# Patient Record
Sex: Female | Born: 1990
Health system: Southern US, Community
[De-identification: ages and names within clinical notes are randomized; demographics above are authoritative.]

## PROBLEM LIST (undated history)

## (undated) DIAGNOSIS — F909 Attention-deficit hyperactivity disorder, unspecified type: Secondary | ICD-10-CM

## (undated) DIAGNOSIS — F329 Major depressive disorder, single episode, unspecified: Secondary | ICD-10-CM

## (undated) DIAGNOSIS — R87619 Unspecified abnormal cytological findings in specimens from cervix uteri: Secondary | ICD-10-CM

## (undated) DIAGNOSIS — K219 Gastro-esophageal reflux disease without esophagitis: Secondary | ICD-10-CM

## (undated) DIAGNOSIS — F411 Generalized anxiety disorder: Secondary | ICD-10-CM

## (undated) DIAGNOSIS — E739 Lactose intolerance, unspecified: Secondary | ICD-10-CM

## (undated) HISTORY — DX: Generalized anxiety disorder: F41.1

## (undated) HISTORY — DX: Unspecified abnormal cytological findings in specimens from cervix uteri: R87.619

## (undated) HISTORY — DX: Lactose intolerance, unspecified: E73.9

## (undated) HISTORY — PX: WISDOM TOOTH EXTRACTION: SHX21

## (undated) HISTORY — PX: COCCYX REMOVAL: SHX600

## (undated) HISTORY — DX: Attention-deficit hyperactivity disorder, unspecified type: F90.9

## (undated) HISTORY — DX: Major depressive disorder, single episode, unspecified: F32.9

## (undated) HISTORY — DX: Gastro-esophageal reflux disease without esophagitis: K21.9

---
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
## 2010-09-23 HISTORY — PX: COCCYX REMOVAL: SHX600

## 2012-01-15 DIAGNOSIS — K589 Irritable bowel syndrome without diarrhea: Secondary | ICD-10-CM | POA: Insufficient documentation

## 2014-02-01 LAB — HEPATITIS B SURFACE ANTIBODY, QUANTITATIVE: Hep B Surface Ab, Qual: 136

## 2014-05-31 LAB — BASIC METABOLIC PANEL
BUN: 10 mg/dL (ref 4–21)
CREATININE: 0.9 mg/dL (ref 0.5–1.1)
Glucose: 44 mg/dL
POTASSIUM: 4.2 mmol/L (ref 3.4–5.3)
Sodium: 139 mmol/L (ref 137–147)

## 2014-05-31 LAB — CBC AND DIFFERENTIAL
HCT: 44 % (ref 36–46)
Hemoglobin: 14.7 g/dL (ref 12.0–16.0)
PLATELETS: 185 10*3/uL (ref 150–399)
WBC: 6.8 10*3/mL

## 2014-05-31 LAB — COMPREHENSIVE METABOLIC PANEL
Albumin Serum: 4.9
CO2: 28 mmol/L
Protein: 7.7

## 2014-05-31 LAB — HEPATIC FUNCTION PANEL
ALT: 12 U/L (ref 7–35)
AST: 18 U/L (ref 13–35)

## 2014-05-31 LAB — VITAMIN D 25 HYDROXY (VIT D DEFICIENCY, FRACTURES): VIT D 25 HYDROXY: 36

## 2014-05-31 LAB — TSH: TSH: 0.65 u[IU]/mL (ref 0.41–5.90)

## 2014-09-19 LAB — TB SKIN TEST
Induration: 0 mm
TB SKIN TEST: NEGATIVE

## 2015-06-03 ENCOUNTER — Encounter: Payer: Self-pay | Admitting: Emergency Medicine

## 2015-06-03 ENCOUNTER — Emergency Department
Admission: EM | Admit: 2015-06-03 | Discharge: 2015-06-03 | Disposition: A | Discharge status: Home / Self Care | Payer: 59 | Source: Home / Self Care | Attending: Family Medicine | Admitting: Family Medicine

## 2015-06-03 ENCOUNTER — Other Ambulatory Visit: Payer: Self-pay | Admitting: Family Medicine

## 2015-06-03 DIAGNOSIS — R112 Nausea with vomiting, unspecified: Secondary | ICD-10-CM

## 2015-06-03 DIAGNOSIS — R197 Diarrhea, unspecified: Secondary | ICD-10-CM

## 2015-06-03 LAB — POCT CBC W AUTO DIFF (K'VILLE URGENT CARE)

## 2015-06-03 MED ORDER — ONDANSETRON HCL 4 MG/2ML IJ SOLN
4.0000 mg | Freq: Once | INTRAMUSCULAR | Status: AC
  Administered 2015-06-03: 4 mg via INTRAMUSCULAR

## 2015-06-03 MED ORDER — ONDANSETRON 4 MG PO TBDP: ORAL_TABLET | ORAL | Status: DC

## 2015-06-03 NOTE — ED Notes (Signed)
Patient is a nurse in ICU; began feeling weak 2 days ago; diarrhea started yesterday; nausea and vomiting today; took care of patient with known Cdiff.

## 2015-06-03 NOTE — ED Provider Notes (Signed)
CSN: [id number]     Arrival date & time 06/03/15  1140 History   First MD Initiated Contact with Patient 06/03/15 1225     Chief Complaint  Patient presents with  . Diarrhea  . Nausea  . Emesis  . Generalized Body Aches  . Fatigue     HPI Comments: About 3 days ago patient developed myalgias and fatigue.  Yesterday she had an episode of watery diarrhea, and five episodes today.  She denies hematochezia.  At 3am today she developed nausea, and later today she had two episodes of vomiting.  She has had crampy abdominal discomfort.  Her urine has been normal.  She notes that she ate cereal/milk this morning. 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  Patient is a 24 y.o. female presenting with diarrhea. The history is provided by the patient.  Diarrhea Quality:  Watery Severity:  Moderate Onset quality:  Sudden Number of episodes:  6 Duration:  2 days Timing:  Intermittent Progression:  Improving Relieved by:  None tried Exacerbated by: eating. Ineffective treatments:  None tried Associated symptoms: abdominal pain, myalgias and vomiting   Associated symptoms: no arthralgias, no chills, no recent cough, no diaphoresis, no fever, no headaches and no URI   Vomiting:    Quality:  Stomach contents   Severity:  Mild   Duration:  1 day   Timing:  Intermittent   Progression:  Unchanged Risk factors: sick contacts   Risk factors: no recent antibiotic use, no suspicious food intake and no travel to endemic areas     Past Medical History  Diagnosis Date  . Anxiety    Past Surgical History  Procedure Laterality Date  . Tonsillectomy    . Coccyx removal     History reviewed. No pertinent family history. Social History  Substance Use Topics  . Smoking status: Never Smoker   . Smokeless tobacco: None  . Alcohol Use: Yes   OB History    No data available     Review of Systems  Constitutional: Negative for  fever, chills and diaphoresis.  Gastrointestinal: Positive for vomiting, abdominal pain and diarrhea.  Musculoskeletal: Positive for myalgias. Negative for arthralgias.  Neurological: Negative for headaches.  All other systems reviewed and are negative.   Allergies  Review of patient's allergies indicates no known allergies.  Home Medications   Prior to Admission medications   Medication Sig Start Date End Date Taking? Authorizing Provider  citalopram (CELEXA) 10 MG tablet Take 10 mg by mouth daily.   Yes Historical Provider, MD  ondansetron (ZOFRAN ODT) 4 MG disintegrating tablet Take one tab by mouth Q6hr prn nausea 06/03/15   Kandra Nicolas, MD   Meds Ordered and Administered this Visit   Medications  ondansetron [facility]) injection 4 mg (not administered)    BP 94/63 mmHg  Pulse 103  Temp(Src) 98.2 F (36.8 C) (Oral)  Ht 5\' 6"  (1.676 m)  Wt 142 lb (64.411 kg)  BMI 22.93 kg/m2  SpO2 97% No data found.   Physical Exam  Constitutional: She is oriented to person, place, and time. She appears well-developed and well-nourished. No distress.  HENT:  Head: Normocephalic.  Nose: Nose normal.  Mouth/Throat: Oropharynx is clear and moist.  Eyes: Conjunctivae are normal. Pupils are equal, round, and reactive to light.  Neck: Neck supple.  Cardiovascular: Normal heart sounds.   Pulmonary/Chest: Breath sounds normal.  Abdominal: Soft. Normal  appearance and bowel sounds are normal. She exhibits no distension and no mass. There is no hepatosplenomegaly or hepatomegaly. There is tenderness in the periumbilical area. There is no rebound, no guarding and no CVA tenderness.    Abdomen has mild diffuse peri-umbilical tenderness  as noted on diagram.  Bowel sounds are present.    Musculoskeletal: She exhibits no edema.  Lymphadenopathy:    She has no cervical adenopathy.  Neurological: She is alert and oriented to person, place, and time.  Skin: Skin is warm and dry. No rash noted.    Nursing note and vitals reviewed.   ED Course  Procedures  None    Labs Reviewed  GI PATHOGEN PANEL BY PCR, STOOL  POCT CBC W AUTO DIFF ([facility]):  WBC 7.1; LY 12.8; MO 5.6; GR 81.6; Hgb 14.7; Platelets 181      MDM   1. Nausea and vomiting, vomiting of unspecified type; suspect viral gastroenteritis   2. Diarrhea    C-diff PCR pending. Zofran 4mg  IM administered.  Rx for Zofran ODT 4mg  Begin clear liquids (Pedialyte while having diarrhea) until improved, then advance to a [employer] (Bananas, Rice, Applesauce, Toast).  Then gradually resume a regular diet when tolerated.  Avoid milk products until well.  To decrease diarrhea, mix one teaspoon Citrucel (methylcellulose) in 2 oz water and drink one to three times daily.  Do not drink extra fluids with this dose and do not drink fluids for one hour afterwards.  When stools become more formed, may take Imodium (loperamide) once or twice daily to decrease stool frequency.  If symptoms become significantly worse during the night or over the weekend, proceed to the local emergency room.  Followup with Family Doctor if not improved in 4 days.    Kandra Nicolas, MD 06/06/15 1901

## 2015-06-03 NOTE — Discharge Instructions (Signed)

## 2015-06-07 ENCOUNTER — Telehealth: Payer: Self-pay | Admitting: Emergency Medicine

## 2015-06-09 ENCOUNTER — Telehealth: Payer: Self-pay | Admitting: Emergency Medicine

## 2015-06-13 ENCOUNTER — Ambulatory Visit (INDEPENDENT_AMBULATORY_CARE_PROVIDER_SITE_OTHER): Payer: 59 | Admitting: Osteopathic Medicine

## 2015-06-13 ENCOUNTER — Encounter: Payer: Self-pay | Admitting: Osteopathic Medicine

## 2015-06-13 VITALS — BP 115/72 | HR 95 | Ht 66.0 in | Wt 138.0 lb

## 2015-06-13 DIAGNOSIS — Z23 Encounter for immunization: Secondary | ICD-10-CM

## 2015-06-13 DIAGNOSIS — K219 Gastro-esophageal reflux disease without esophagitis: Secondary | ICD-10-CM

## 2015-06-13 HISTORY — DX: Gastro-esophageal reflux disease without esophagitis: K21.9

## 2015-06-13 MED ORDER — OMEPRAZOLE 20 MG PO TBEC: 20.0000 mg | DELAYED_RELEASE_TABLET | Freq: Every day | ORAL | Status: DC

## 2015-06-13 NOTE — Patient Instructions (Signed)
OMEPRAZOLE 20mg  TABS: Can take 2 tabs or dose twice daily for severe symptoms Continue this medication for 6 - 12 weeks, then will discontinue  If symptoms return once medication is stopped, will plan on maintenance therapy with Omeprazole (Prilosec) or Ranitidine (Zantac). If symptoms resolve, then will treat as needed. If symptoms show no improvement or get worse, or if medication isn't giving much benefit, will consider referral to GI for EGD procedure.      Gastroesophageal Reflux Disease, Adult Gastroesophageal reflux disease (GERD) happens when acid from your stomach flows up into the esophagus. When acid comes in contact with the esophagus, the acid causes soreness (inflammation) in the esophagus. Over time, GERD may create small holes (ulcers) in the lining of the esophagus. CAUSES   Increased body weight. This puts pressure on the stomach, making acid rise from the stomach into the esophagus.  Smoking. This increases acid production in the stomach.  Drinking alcohol. This causes decreased pressure in the lower esophageal sphincter (valve or ring of muscle between the esophagus and stomach), allowing acid from the stomach into the esophagus.  Late evening meals and a full stomach. This increases pressure and acid production in the stomach.  A malformed lower esophageal sphincter. Sometimes, no cause is found. SYMPTOMS   Burning pain in the lower part of the mid-chest behind the breastbone and in the mid-stomach area. This may occur twice a week or more often.  Trouble swallowing.  Sore throat.  Dry cough.  Asthma-like symptoms including chest tightness, shortness of breath, or wheezing. DIAGNOSIS  Your caregiver may be able to diagnose GERD based on your symptoms. In some cases, X-rays and other tests may be done to check for complications or to check the condition of your stomach and esophagus. TREATMENT  Your caregiver may recommend over-the-counter or prescription  medicines to help decrease acid production. Ask your caregiver before starting or adding any new medicines.  HOME CARE INSTRUCTIONS   Change the factors that you can control. Ask your caregiver for guidance concerning weight loss, quitting smoking, and alcohol consumption.  Avoid foods and drinks that make your symptoms worse, such as:  Caffeine or alcoholic drinks.  Chocolate.  Peppermint or mint flavorings.  Garlic and onions.  Spicy foods.  Citrus fruits, such as oranges, lemons, or limes.  Tomato-based foods such as sauce, chili, salsa, and pizza.  Fried and fatty foods.  Avoid lying down for the 3 hours prior to your bedtime or prior to taking a nap.  Eat small, frequent meals instead of large meals.  Wear loose-fitting clothing. Do not wear anything tight around your waist that causes pressure on your stomach.  Raise the head of your bed 6 to 8 inches with wood blocks to help you sleep. Extra pillows will not help.  Only take over-the-counter or prescription medicines for pain, discomfort, or fever as directed by your caregiver.  Do not take aspirin, ibuprofen, or other nonsteroidal anti-inflammatory drugs (NSAIDs). SEEK IMMEDIATE MEDICAL CARE IF:   You have pain in your arms, neck, jaw, teeth, or back.  Your pain increases or changes in intensity or duration.  You develop nausea, vomiting, or sweating (diaphoresis).  You develop shortness of breath, or you faint.  Your vomit is green, yellow, black, or looks like coffee grounds or blood.  Your stool is red, bloody, or black. These symptoms could be signs of other problems, such as heart disease, gastric bleeding, or esophageal bleeding. MAKE SURE YOU:   Understand these instructions.  Will watch your condition.  Will get help right away if you are not doing well or get worse. Document Released: 06/19/2005 Document Revised: 12/02/2011 Document Reviewed: 03/29/2011 [facility] Patient Information 2015  [place], [place]. This information is not intended to replace advice given to you by your health care provider. Make sure you discuss any questions you have with your health care provider.

## 2015-06-13 NOTE — Progress Notes (Signed)
HPI: Kelly Haynes is a 24 y.o. female who presents to [facility]  today for chief complaint of:  Chief Complaint  Patient presents with  . Establish Care  . Gastrophageal Reflux    ACID REFLUX . Location: epigastric . Quality: burning . Severity: moderate . Duration: few years (4 or more) . Timing: intermittent, worse over past few months.  . Modifying factors: previously on prescription but it din't help very much. Has tried omeprazole 14 days over the counter x 2 rounds and this helped. Has bene off this for few week and pain came back. Not tested for H Pylori that she can remember . Assoc signs/symptoms: hx vomiting few years ago  Had EGD few years ago, showed gastritis and benign polyp, repeat showed no polyp, was told she just had bad acid reflus.   See below for preventive care  Past medical, social and family history reviewed: Past Medical History  Diagnosis Date  . Anxiety    Past Surgical History  Procedure Laterality Date  . Tonsillectomy    . Coccyx removal     Social History  Substance Use Topics  . Smoking status: Never Smoker   . Smokeless tobacco: Not on file  . Alcohol Use: Yes   No family history on file.  Current Outpatient Prescriptions  Medication Sig Dispense Refill  . citalopram (CELEXA) 10 MG tablet Take 10 mg by mouth daily.     No current facility-administered medications for this visit.   No Known Allergies    Review of Systems: CONSTITUTIONAL: Neg fever/chills, no unintentional weight changes HEAD/EYES/EARS/NOSE/THROAT: No headache/vision change or hearing change, no sore throat CARDIAC: No chest pain/pressure/palpitations, no orthopnea RESPIRATORY: No cough/shortness of breath/wheeze GASTROINTESTINAL: No nausea/vomiting/abdominal pain/blood in stool/diarrhea/constipation. (+) acid reflux MUSCULOSKELETAL: No myalgia/arthralgia GENITOURINARY: No incontinence, No abnormal genital  bleeding/discharge SKIN: No rash/wounds/concerning lesions HEM/ONC: No easy bruising/bleeding, no abnormal lymph node ENDOCRINE: No polyuria/polydipsia/polyphagia, no heat/cold intolerance  NEUROLOGIC: No weakness/dizzines/slurred speech PSYCHIATRIC: No concerns with depression/anxiety or sleep problems    Exam:  BP 115/72 mmHg  Pulse 95  Ht 5\' 6"  (1.676 m)  Wt 138 lb (62.596 kg)  BMI 22.28 kg/m2  SpO2 95% Constitutional: VSS, see above. General Appearance: alert, well-developed, well-nourished, NAD Eyes: Normal lids and conjunctive, non-icteric sclera, PERRLA Ears, Nose, Mouth, Throat: Normal external inspection ears/nares/mouth/lips/gums, Normal TM bilaterally, MMM, posterior pharynx without erythema/exudate Neck: No masses, trachea midline. No thyroid enlargement/tenderness/mass appreciated Respiratory: Normal respiratory effort. no wheeze/rhonchi/rales Cardiovascular: S1/S2 normal, no murmur/rub/gallop auscultated. RRR. No carotid bruit or JVD. No abdominal aortic bruit. Pedal pulse II/IV bilaterally DP and PT. No lower extremity edema. Gastrointestinal: Nontender, no masses. No hepatomegaly, no splenomegaly. No hernia appreciated. Bowel sounds normal. Rectal exam deferred.  Musculoskeletal: Gait normal. No clubbing/cyanosis of digits.  Neurological: No cranial nerve deficit on limited exam. Motor and sensation intact and symmetric Psychiatric: Normal judgment/insight. Normal mood and affect. Oriented x3.    No results found for this or any previous visit (from the past 72 hour(s)).    ASSESSMENT/PLAN:  Gastroesophageal reflux disease, esophagitis presence not specified - Plan: H. pylori breath test, Omeprazole (EQ OMEPRAZOLE) 20 MG TBEC  OMEPRAZOLE 20mg  TABS: Can take 2 tabs or dose twice daily for severe symptoms Continue this medication for 6 - 12 weeks, then will discontinue  If symptoms return once medication is stopped, will plan on maintenance therapy with Omeprazole  (Prilosec) or Ranitidine (Zantac). If symptoms resolve, then will treat as needed. If symptoms show  no improvement or get worse, or if medication isn't giving much benefit, will consider referral to GI for EGD procedure.   Await H Pylori test   FEMALE PREVENTIVE CARE  ANNUAL SCREENING/COUNSELING Tobacco - Never  Alcohol - social drinker Diet/Exercise - HEALTHY HABITS DISCUSSED TO DECREASE CV RISK Sexual Health - Yes with female. STI - The patient denies history of sexually transmitted disease. INTERESTED IN STI TESTING - no Depression - PQH2 Not Available Domestic violence concerns - no HTN SCREENING - SEE VITALS Vaccination status - SEE BELOW  INFECTIOUS DISEASE SCREENING HIV - all adults 15-65 - does not need GC/CT - sexually active - does not need HepC - born 24-1967 - does not need TB - if risk/required by employer - does not need  DISEASE SCREENING Lipid - (Low risk screen M35/F45; High risk screen M25/F35 if HTN, Tob, FH CHD M<55/F<65) - does not need DM2 (45+ or Risk = FH 1st deg DM, Hx GDM, overweight/sedentary, high-risk ethnicity, HTN) - does not need Osteoporosis - age 45+ or one sooner if risk - does not need  CANCER SCREENING Cervical - Pap q3 yr age 55+, Pap + HPV q5y age 30+ - PAP - does not need Breast - Mammo age 39+ (C) and biennial age 33-75 (A) - 13 - does not need Lung - annual low dose CT Chest age 16-75 w/ 30+ PY, current/quit past 15 years - CT - does not need Colon - age 67+ or 26 years of age prior to [place] Dx - GI REFERRAL - does not need  ADULT VACCINATION Influenza - annual - was given Td booster every 10 years - already has HPV - age <53yo - was not indicated Zoster - age 65+ - was not indicated Pneumonia - age 83+ sooner if risk (DM, smoker, other) - was not indicated  OTHER Fall - exercise and Vit D age 41+ - does not need Consider ASA - age 35-59 - does not need

## 2015-06-14 LAB — H. PYLORI BREATH TEST: H. PYLORI BREATH TEST: NOT DETECTED

## 2015-06-16 ENCOUNTER — Encounter: Payer: Self-pay | Admitting: Osteopathic Medicine

## 2015-06-16 DIAGNOSIS — Z9089 Acquired absence of other organs: Secondary | ICD-10-CM | POA: Insufficient documentation

## 2015-06-16 DIAGNOSIS — F419 Anxiety disorder, unspecified: Secondary | ICD-10-CM | POA: Insufficient documentation

## 2015-06-16 DIAGNOSIS — F411 Generalized anxiety disorder: Secondary | ICD-10-CM

## 2015-06-16 DIAGNOSIS — Z975 Presence of (intrauterine) contraceptive device: Secondary | ICD-10-CM | POA: Insufficient documentation

## 2015-06-16 DIAGNOSIS — K317 Polyp of stomach and duodenum: Secondary | ICD-10-CM | POA: Insufficient documentation

## 2015-06-16 DIAGNOSIS — F418 Other specified anxiety disorders: Secondary | ICD-10-CM | POA: Insufficient documentation

## 2015-06-16 DIAGNOSIS — E739 Lactose intolerance, unspecified: Secondary | ICD-10-CM | POA: Insufficient documentation

## 2015-06-16 DIAGNOSIS — F329 Major depressive disorder, single episode, unspecified: Secondary | ICD-10-CM | POA: Insufficient documentation

## 2015-06-16 DIAGNOSIS — F32A Depression, unspecified: Secondary | ICD-10-CM

## 2015-06-16 HISTORY — PX: TONSILLECTOMY: SUR1361

## 2015-06-16 HISTORY — DX: Lactose intolerance, unspecified: E73.9

## 2015-06-16 HISTORY — DX: Depression, unspecified: F32.A

## 2015-06-16 HISTORY — DX: Generalized anxiety disorder: F41.1

## 2015-08-30 ENCOUNTER — Encounter: Payer: Self-pay | Admitting: Osteopathic Medicine

## 2015-10-03 ENCOUNTER — Other Ambulatory Visit: Payer: Self-pay | Admitting: Osteopathic Medicine

## 2015-10-03 MED FILL — CITALOPRAM HBR 20 MG TABLET: 20 | 90 days supply | Qty: 45 | Fill #1

## 2015-10-03 MED FILL — ALPRAZolam 0.5 MG TABS: 0.5 | 30 days supply | Qty: 30 | Fill #1

## 2015-10-04 MED FILL — OMEPRAZOLE DR 20 MG CAPSULE: 20 | 23 days supply | Qty: 30 | Fill #0

## 2015-10-25 DIAGNOSIS — Z01419 Encounter for gynecological examination (general) (routine) without abnormal findings: Secondary | ICD-10-CM | POA: Diagnosis not present

## 2015-10-25 DIAGNOSIS — Z139 Encounter for screening, unspecified: Secondary | ICD-10-CM | POA: Diagnosis not present

## 2015-10-25 DIAGNOSIS — Z8742 Personal history of other diseases of the female genital tract: Secondary | ICD-10-CM | POA: Diagnosis not present

## 2015-11-15 MED FILL — OMEPRAZOLE DR 20 MG CAPSULE: 20 | 23 days supply | Qty: 30 | Fill #1

## 2015-12-13 DIAGNOSIS — F3340 Major depressive disorder, recurrent, in remission, unspecified: Secondary | ICD-10-CM | POA: Diagnosis not present

## 2015-12-13 DIAGNOSIS — F900 Attention-deficit hyperactivity disorder, predominantly inattentive type: Secondary | ICD-10-CM | POA: Diagnosis not present

## 2015-12-13 DIAGNOSIS — F5112 Insufficient sleep syndrome: Secondary | ICD-10-CM | POA: Diagnosis not present

## 2015-12-13 DIAGNOSIS — F411 Generalized anxiety disorder: Secondary | ICD-10-CM | POA: Diagnosis not present

## 2015-12-21 MED FILL — ALPRAZolam 0.5 MG TABS: 0.5 | 7 days supply | Qty: 30 | Fill #0

## 2015-12-21 MED FILL — OMEPRAZOLE DR 20 MG CAPSULE: 20 | 23 days supply | Qty: 30 | Fill #2

## 2015-12-21 MED FILL — traZODone HCL 100 MG TABS: 100 | 30 days supply | Qty: 60 | Fill #0

## 2015-12-21 MED FILL — CITALOPRAM HBR 10 MG TABLET: 10 | 30 days supply | Qty: 30 | Fill #0

## 2016-01-18 DIAGNOSIS — F900 Attention-deficit hyperactivity disorder, predominantly inattentive type: Secondary | ICD-10-CM | POA: Diagnosis not present

## 2016-01-18 DIAGNOSIS — F3340 Major depressive disorder, recurrent, in remission, unspecified: Secondary | ICD-10-CM | POA: Diagnosis not present

## 2016-01-18 DIAGNOSIS — F5112 Insufficient sleep syndrome: Secondary | ICD-10-CM | POA: Diagnosis not present

## 2016-01-18 DIAGNOSIS — F411 Generalized anxiety disorder: Secondary | ICD-10-CM | POA: Diagnosis not present

## 2016-01-23 MED FILL — OMEPRAZOLE DR 20 MG CAPSULE: 20 | 23 days supply | Qty: 30 | Fill #3

## 2016-02-23 ENCOUNTER — Other Ambulatory Visit: Payer: Self-pay | Admitting: Osteopathic Medicine

## 2016-02-23 MED FILL — ALPRAZolam 0.5 MG TABS: 0.5 | 7 days supply | Qty: 30 | Fill #1

## 2016-02-23 MED FILL — CITALOPRAM HBR 10 MG TABLET: 10 | 30 days supply | Qty: 30 | Fill #0

## 2016-02-23 MED FILL — OMEPRAZOLE DR 20 MG CAPSULE: 20 | 30 days supply | Qty: 30 | Fill #0

## 2016-03-18 MED FILL — CITALOPRAM HBR 10 MG TABLET: 10 | 30 days supply | Qty: 30 | Fill #1

## 2016-03-18 MED FILL — OMEPRAZOLE DR 20 MG CAPSULE: 20 | 30 days supply | Qty: 30 | Fill #1

## 2016-04-12 DIAGNOSIS — F411 Generalized anxiety disorder: Secondary | ICD-10-CM | POA: Diagnosis not present

## 2016-04-12 DIAGNOSIS — F3340 Major depressive disorder, recurrent, in remission, unspecified: Secondary | ICD-10-CM | POA: Diagnosis not present

## 2016-04-12 DIAGNOSIS — F5112 Insufficient sleep syndrome: Secondary | ICD-10-CM | POA: Diagnosis not present

## 2016-04-12 DIAGNOSIS — F900 Attention-deficit hyperactivity disorder, predominantly inattentive type: Secondary | ICD-10-CM | POA: Diagnosis not present

## 2016-04-23 MED FILL — CITALOPRAM HBR 10 MG TABLET: 10 | 30 days supply | Qty: 30 | Fill #0

## 2016-04-23 MED FILL — ALPRAZolam 0.5 MG TABS: 0.5 | 7 days supply | Qty: 30 | Fill #2

## 2016-04-23 MED FILL — OMEPRAZOLE DR 20 MG CAPSULE: 20 | 30 days supply | Qty: 30 | Fill #2

## 2016-05-29 MED FILL — OMEPRAZOLE DR 20 MG CAPSULE: 20 | 30 days supply | Qty: 30 | Fill #3

## 2016-05-29 MED FILL — CITALOPRAM HBR 10 MG TABLET: 10 | 30 days supply | Qty: 30 | Fill #1

## 2016-06-25 ENCOUNTER — Other Ambulatory Visit: Payer: Self-pay | Admitting: Osteopathic Medicine

## 2016-06-25 MED FILL — OMEPRAZOLE 20 MG CAPSULE DR: 20 | 30 days supply | Qty: 30 | Fill #0

## 2016-07-02 MED FILL — CITALOPRAM HBR 10 MG TABLET: 10 | 30 days supply | Qty: 30 | Fill #0

## 2016-07-23 DIAGNOSIS — F3340 Major depressive disorder, recurrent, in remission, unspecified: Secondary | ICD-10-CM | POA: Diagnosis not present

## 2016-07-23 DIAGNOSIS — F900 Attention-deficit hyperactivity disorder, predominantly inattentive type: Secondary | ICD-10-CM | POA: Diagnosis not present

## 2016-07-23 DIAGNOSIS — F411 Generalized anxiety disorder: Secondary | ICD-10-CM | POA: Diagnosis not present

## 2016-07-23 DIAGNOSIS — F5112 Insufficient sleep syndrome: Secondary | ICD-10-CM | POA: Diagnosis not present

## 2016-07-26 MED FILL — OMEPRAZOLE 20 MG CAPSULE DR: 20 | 30 days supply | Qty: 30 | Fill #1

## 2016-07-29 MED FILL — CITALOPRAM HBR 20 MG TABLET: 20 | 30 days supply | Qty: 30 | Fill #0

## 2016-07-29 MED FILL — ALPRAZolam 0.5 MG TABS: 0.5 | 30 days supply | Qty: 30 | Fill #0

## 2016-08-28 MED FILL — OMEPRAZOLE 20 MG CAPSULE DR: 20 | 30 days supply | Qty: 30 | Fill #2

## 2016-08-28 MED FILL — CITALOPRAM HBR 20 MG TABLET: 20 | 30 days supply | Qty: 30 | Fill #1

## 2016-10-02 MED FILL — OMEPRAZOLE 20 MG CAPSULE DR: 20 | 30 days supply | Qty: 30 | Fill #3

## 2016-10-02 MED FILL — ALPRAZolam 0.5 MG TABS: 0.5 | 30 days supply | Qty: 30 | Fill #1

## 2016-10-08 MED FILL — CITALOPRAM HBR 20 MG TABLET: 20 | 30 days supply | Qty: 30 | Fill #0

## 2016-11-01 DIAGNOSIS — F3340 Major depressive disorder, recurrent, in remission, unspecified: Secondary | ICD-10-CM | POA: Diagnosis not present

## 2016-11-01 DIAGNOSIS — F5112 Insufficient sleep syndrome: Secondary | ICD-10-CM | POA: Diagnosis not present

## 2016-11-01 DIAGNOSIS — F411 Generalized anxiety disorder: Secondary | ICD-10-CM | POA: Diagnosis not present

## 2016-11-01 DIAGNOSIS — F900 Attention-deficit hyperactivity disorder, predominantly inattentive type: Secondary | ICD-10-CM | POA: Diagnosis not present

## 2016-11-01 MED FILL — CITALOPRAM HBR 20 MG TABLET: 20 | 30 days supply | Qty: 30 | Fill #0

## 2016-11-01 MED FILL — ALPRAZolam 0.5 MG TABS: 0.5 | 7 days supply | Qty: 30 | Fill #0

## 2016-11-01 MED FILL — ARMODAFINIL 250 MG TABLET: 250 | 33 days supply | Qty: 30 | Fill #0

## 2016-11-12 ENCOUNTER — Encounter: Payer: Self-pay | Admitting: Physician Assistant

## 2016-11-12 ENCOUNTER — Ambulatory Visit (INDEPENDENT_AMBULATORY_CARE_PROVIDER_SITE_OTHER): Payer: 59 | Admitting: Physician Assistant

## 2016-11-12 VITALS — BP 112/73 | HR 80 | Temp 98.8°F | Resp 16 | Ht 66.0 in | Wt 136.0 lb

## 2016-11-12 DIAGNOSIS — F411 Generalized anxiety disorder: Secondary | ICD-10-CM

## 2016-11-12 DIAGNOSIS — Z30433 Encounter for removal and reinsertion of intrauterine contraceptive device: Secondary | ICD-10-CM | POA: Diagnosis not present

## 2016-11-12 DIAGNOSIS — Z7689 Persons encountering health services in other specified circumstances: Secondary | ICD-10-CM

## 2016-11-12 DIAGNOSIS — K219 Gastro-esophageal reflux disease without esophagitis: Secondary | ICD-10-CM | POA: Diagnosis not present

## 2016-11-12 MED ORDER — OMEPRAZOLE 40 MG PO CPDR: 40.0000 mg | DELAYED_RELEASE_CAPSULE | Freq: Every day | ORAL | 3 refills | Status: DC

## 2016-11-12 MED FILL — OMEPRAZOLE DR 40 MG CAPSULE: 40 | 90 days supply | Qty: 90 | Fill #0

## 2016-11-12 NOTE — Progress Notes (Signed)
Patient ID: Kelly Haynes, female     DOB: 02-Oct-1990, 26 y.o.    MRN: [id number]  PCP: Harrison Mons, PA-C  Chief Complaint  Patient presents with  . Establish Care    Subjective:   This patient is new to this practice and presents for evaluation of GERD and to establish care. She has moved and this practice is more convenient.   "My acid reflux is out of control." "I've been on omeprazole for several years now." Past two weeks she's been waking up with symptoms. Dietary changes have helped some. Eliminated caffeine, which is hard, since she works nights. S/P upper endoscopy x 3.  Review of Systems Dyspepsia. No CP, SOB, HA, dizziness. No urinary symptoms.   Prior to Admission medications   Medication Sig Start Date End Date Taking? Authorizing Provider  ALPRAZolam Duanne Moron) 0.5 MG tablet Take 0.5 mg by mouth at bedtime as needed for anxiety.   Yes Historical Provider, MD  citalopram (CELEXA) 20 MG tablet Take 20 mg by mouth daily.   Yes Historical Provider, MD  Multiple Vitamins-Minerals (MULTIVITAMIN PO) Take by mouth daily.   Yes Historical Provider, MD  Omeprazole (EQ OMEPRAZOLE) 20 MG TBEC Take 1 tablet (20 mg total) by mouth daily. (Can take 2 tabs daily x first 2 weeks of treatment) 06/13/15  Yes Emeterio Reeve, DO  omeprazole (PRILOSEC) 20 MG capsule TAKE 1 CAPSULE BY MOUTH ONCE DAILY *CAN TAKE 2 CAPSULES DAILY FOR FIRST 2 WEEKS OF TREATMENT* 06/25/16  Yes Emeterio Reeve, DO     No Known Allergies   Patient Active Problem List   Diagnosis Date Noted  . S/P tonsillectomy 06/16/2015  . Anxiety state 06/16/2015  . Depression 06/16/2015  . Lactose intolerance 06/16/2015  . Gastric polyp 06/16/2015  . IUD (intrauterine device) in place 06/16/2015  . GERD (gastroesophageal reflux disease) 06/13/2015  . Irritable bowel syndrome 01/15/2012     Family History  Problem Relation Age of Onset  . Depression Father   . Depression Sister   . Hypertension  Maternal Grandfather   . Hypertension Paternal Grandfather   . Depression Cousin      Social History   Social History  . Marital status: Single    Spouse name: N/A  . Number of children: 0  . Years of education: BSN   Occupational History  . CICU RN     [employer]   Social History Main Topics  . Smoking status: Never Smoker  . Smokeless tobacco: Never Used  . Alcohol use Yes     Comment: 2 drinks/week  . Drug use: No  . Sexual activity: Not Currently    Partners: Male    Birth control/ protection: IUD   Other Topics Concern  . Not on file   Social History Narrative   Lives with a roommate, also a Marine scientist.   Mother lives in [place], [place].   Father lives in [place], [place].   Smoke detectors in home? Yes    Guns in home? No    Consistent seatbelt use? Yes    Consistent dental brushing and flossing? Yes    Semi-Annual dental visits: Yes    Annual Eye exams: last visit 2 years ago            Objective:  Physical Exam           Assessment & Plan:  1. Encounter to establish care  2. Gastroesophageal reflux disease, esophagitis presence not specified Increase omeprazole to 40  mg daily.If symptoms persist, plan re-evaluation with GI. - omeprazole (PRILOSEC) 40 MG capsule; Take 1 capsule (40 mg total) by mouth daily.  Dispense: 90 capsule; Refill: 3  3. Generalized anxiety disorder Stable. Continue current treatment.  4. Encounter for removal and reinsertion of intrauterine contraceptive device (IUD) - Ambulatory referral to Obstetrics / Gynecology   Fara Chute, PA-C Physician Assistant-Certified [facility]

## 2016-11-12 NOTE — Patient Instructions (Addendum)
Things that often make reflux symptoms worse: Caffeine Carbonation (soda) Spicy foods Acidic foods (like tomato sauce, orange juice, lemonade) Fatty foods (including whole milk and ice cream) Stress (feeling sad, worried, nervous) Nicotine Alcohol NSAIDS (non-steroidal anti-inflammatories, like ibuprofen (Advil, Motrin) or naproxen (Aleve)).   You can try increasing the citalopram from 20 mg to 30 and then 40 mg if you need to. Just let me know if you find that the higher dose works and [employer] send a new prescription to your pharmacy.  IF you received an x-ray today, you will receive an invoice from [facility]. Please contact [facility] at [phone] with questions or concerns regarding your invoice.   IF you received labwork today, you will receive an invoice from [facility]. Please contact [facility] at [phone] with questions or concerns regarding your invoice.   Our billing staff will not be able to assist you with questions regarding bills from these companies.  You will be contacted with the lab results as soon as they are available. The fastest way to get your results is to activate your My Chart account. Instructions are located on the last page of this paperwork. If you have not heard from [place] regarding the results in 2 weeks, please contact this office.

## 2016-12-04 MED FILL — ALPRAZolam 0.5 MG TABS: 0.5 | 7 days supply | Qty: 30 | Fill #1

## 2016-12-04 MED FILL — CITALOPRAM HBR 20 MG TABLET: 20 | 30 days supply | Qty: 30 | Fill #1

## 2016-12-17 ENCOUNTER — Ambulatory Visit (INDEPENDENT_AMBULATORY_CARE_PROVIDER_SITE_OTHER): Payer: 59 | Admitting: Obstetrics & Gynecology

## 2016-12-17 ENCOUNTER — Encounter: Payer: Self-pay | Admitting: Obstetrics & Gynecology

## 2016-12-17 VITALS — BP 114/76 | HR 73 | Wt 135.8 lb

## 2016-12-17 DIAGNOSIS — Z113 Encounter for screening for infections with a predominantly sexual mode of transmission: Secondary | ICD-10-CM

## 2016-12-17 DIAGNOSIS — Z01419 Encounter for gynecological examination (general) (routine) without abnormal findings: Secondary | ICD-10-CM

## 2016-12-17 DIAGNOSIS — Z30433 Encounter for removal and reinsertion of intrauterine contraceptive device: Secondary | ICD-10-CM | POA: Diagnosis not present

## 2016-12-17 DIAGNOSIS — Z202 Contact with and (suspected) exposure to infections with a predominantly sexual mode of transmission: Secondary | ICD-10-CM

## 2016-12-17 MED ORDER — LEVONORGESTREL 20 MCG/24HR IU IUD
INTRAUTERINE_SYSTEM | Freq: Once | INTRAUTERINE | Status: AC
  Administered 2016-12-17: 1 via INTRAUTERINE

## 2016-12-17 NOTE — Patient Instructions (Signed)

## 2016-12-17 NOTE — Progress Notes (Signed)
Subjective:     Kelly Haynes is a 26 y.o. female here for a routine exam.  G0 LMP irreg and rare due to LnIUD Current complaints: non currently sexually active.     Gynecologic History No LMP recorded. Patient is not currently having periods (Reason: IUD). Contraception: IUD Last Pap:09/2014. Results were: normal Last mammogram: n/a.  Obstetric History OB History  No data available   The following portions of the patient's history were reviewed and updated as appropriate: allergies, current medications, past family history, past medical history, past social history, past surgical history and problem list.  Review of Systems Pertinent items are noted in HPI.    Objective:   BP 114/76   Pulse 73   Wt 135 lb 12.8 oz (61.6 kg)   BMI 21.92 kg/m  General Appearance:    Alert, cooperative, no distress, appears stated age  Head:    Normocephalic, without obvious abnormality, atraumatic  Eyes:    conjunctiva/corneas clear, EOM's intact, both eyes  Ears:    Normal external ear canals, both ears  Nose:   Nares normal, septum midline, mucosa normal, no drainage    or sinus tenderness  Throat:   Lips, mucosa, and tongue normal; teeth and gums normal  Neck:   Supple, symmetrical, trachea midline, no adenopathy;    thyroid:  no enlargement/tenderness/nodules  Back:     Symmetric, no curvature, ROM normal, no CVA tenderness  Lungs:     Clear to auscultation bilaterally, respirations unlabored  Chest Wall:    No tenderness or deformity   Heart:    Regular rate and rhythm, S1 and S2 normal, no murmur, rub   or gallop  Breast Exam:    No tenderness, masses, or nipple abnormality  Abdomen:     Soft, non-tender, bowel sounds active all four quadrants,    no masses, no organomegaly  Genitalia:    Normal female without lesion, discharge or tenderness   (see below)  Extremities:   Extremities normal, atraumatic, no cyanosis or edema  Pulses:   2+ and symmetric all extremities  Skin:   Skin color,  texture, turgor normal, no rashes or lesions    IUD Insertion Procedure Note Patient identified, informed consent performed.  Discussed risks of irregular bleeding, cramping, infection, malpositioning or misplacement of the IUD outside the uterus which may require further procedures. Time out was performed.  Urine pregnancy test negative.  Speculum placed in the vagina.  Cervix visualized.  Cleaned with Betadine x 2.  The IUD in place was grasped with forceps and removed in its entirety.  The cervix was then grasped anteriorly with a single tooth tenaculum.  Uterus sounded to 7 cm.  Mirena IUD placed per manufacturer's recommendations.  Strings trimmed to 3 cm. Tenaculum was removed, good hemostasis noted.  Patient tolerated procedure well.    Assessment:    Healthy female exam.   Contraception management- IUD removed and replaced   Plan:    Follow up in: 1 year.  for annual  Patient was given post-procedure instructions.  Patient was asked to follow up in 4 weeks for IUD check. f/u PAP and cx  Kelly Haynes, M.D., Kelly Haynes

## 2016-12-19 LAB — CERVICOVAGINAL ANCILLARY ONLY
Chlamydia: NEGATIVE
NEISSERIA GONORRHEA: NEGATIVE
TRICH (WINDOWPATH): NEGATIVE

## 2016-12-30 MED FILL — ARMODAFINIL 250 MG TABLET: 250 | 90 days supply | Qty: 90 | Fill #0

## 2017-01-09 ENCOUNTER — Encounter (HOSPITAL_COMMUNITY): Payer: Self-pay | Admitting: Family Medicine

## 2017-01-09 ENCOUNTER — Ambulatory Visit (HOSPITAL_COMMUNITY)
Admission: EM | Admit: 2017-01-09 | Discharge: 2017-01-09 | Disposition: A | Discharge status: Home / Self Care | Payer: 59 | Attending: Family Medicine | Admitting: Family Medicine

## 2017-01-09 DIAGNOSIS — R300 Dysuria: Secondary | ICD-10-CM | POA: Diagnosis not present

## 2017-01-09 DIAGNOSIS — R319 Hematuria, unspecified: Secondary | ICD-10-CM

## 2017-01-09 DIAGNOSIS — N3090 Cystitis, unspecified without hematuria: Secondary | ICD-10-CM

## 2017-01-09 DIAGNOSIS — Z3202 Encounter for pregnancy test, result negative: Secondary | ICD-10-CM

## 2017-01-09 LAB — POCT URINALYSIS DIP (DEVICE)
BILIRUBIN URINE: NEGATIVE
Glucose, UA: NEGATIVE mg/dL
KETONES UR: NEGATIVE mg/dL
NITRITE: NEGATIVE
Protein, ur: 100 mg/dL — AB
Specific Gravity, Urine: 1.02 (ref 1.005–1.030)
Urobilinogen, UA: 0.2 mg/dL (ref 0.0–1.0)
pH: 7.5 (ref 5.0–8.0)

## 2017-01-09 LAB — POCT PREGNANCY, URINE: Preg Test, Ur: NEGATIVE

## 2017-01-09 MED ORDER — CIPROFLOXACIN HCL 500 MG PO TABS
ORAL_TABLET | ORAL | Status: AC
  Filled 2017-01-09: qty 1

## 2017-01-09 MED ORDER — CIPROFLOXACIN HCL 500 MG PO TABS
500.0000 mg | ORAL_TABLET | Freq: Once | ORAL | Status: AC
  Administered 2017-01-09: 500 mg via ORAL

## 2017-01-09 MED ORDER — CIPROFLOXACIN HCL 500 MG PO TABS: 500.0000 mg | ORAL_TABLET | Freq: Two times a day (BID) | ORAL | 0 refills | Status: DC

## 2017-01-09 MED FILL — ALPRAZolam 0.5 MG TABS: 0.5 | 7 days supply | Qty: 30 | Fill #2

## 2017-01-09 NOTE — ED Provider Notes (Signed)
[place]    CSN: [id number] Arrival date & time: 01/09/17  1751     History   Chief Complaint Chief Complaint  Patient presents with  . Dysuria    HPI Cathyrn Deas is a 26 y.o. female.   Pt here for UTI symptoms. Patient's symptoms began this morning when she noticed dysuria. She works nights at [employer] so she saw up until about 3, but when she awoke she noticed more dysuria and hematuria.  Patient has had pyelonephritis in the past with fever and chills. She is denying these symptoms now, with no flank pain, nausea, or vomiting.      Past Medical History:  Diagnosis Date  . Anxiety state 06/16/2015  . Depression 06/16/2015  . GERD (gastroesophageal reflux disease) 06/13/2015  . Lactose intolerance 06/16/2015    Patient Active Problem List   Diagnosis Date Noted  . S/P tonsillectomy 06/16/2015  . Anxiety state 06/16/2015  . Depression 06/16/2015  . Lactose intolerance 06/16/2015  . Gastric polyp 06/16/2015  . IUD (intrauterine device) in place 06/16/2015  . GERD (gastroesophageal reflux disease) 06/13/2015  . Irritable bowel syndrome 01/15/2012    Past Surgical History:  Procedure Laterality Date  . COCCYX REMOVAL    . TONSILLECTOMY  06/16/2015  . WISDOM TOOTH EXTRACTION      OB History    No data available       Home Medications    Prior to Admission medications   Medication Sig Start Date End Date Taking? Authorizing Provider  ALPRAZolam Duanne Moron) 0.5 MG tablet Take 0.5 mg by mouth at bedtime as needed for anxiety.    Historical Provider, MD  ciprofloxacin (CIPRO) 500 MG tablet Take 1 tablet (500 mg total) by mouth 2 (two) times daily. 01/09/17   Robyn Haber, MD  citalopram (CELEXA) 20 MG tablet Take 20 mg by mouth daily.     Historical Provider, MD  Multiple Vitamins-Minerals (MULTIVITAMIN PO) Take by mouth daily.    Historical Provider, MD  omeprazole (PRILOSEC) 20 MG capsule  10/02/16   Historical Provider, MD  omeprazole (PRILOSEC) 40  MG capsule Take 1 capsule (40 mg total) by mouth daily. 11/12/16   Harrison Mons, PA-C    Family History Family History  Problem Relation Age of Onset  . Depression Father   . Depression Sister   . Hypertension Maternal Grandfather   . Hypertension Paternal Grandfather   . Depression Cousin     Social History Social History  Substance Use Topics  . Smoking status: Never Smoker  . Smokeless tobacco: Never Used  . Alcohol use Yes     Comment: 2 drinks/week     Allergies   Patient has no known allergies.   Review of Systems Review of Systems  Genitourinary: Positive for dysuria and hematuria.  All other systems reviewed and are negative.    Physical Exam Triage Vital Signs ED Triage Vitals [01/09/17 1806]  Enc Vitals Group     BP 132/80     Pulse Rate 82     Resp 18     Temp 97 F (36.1 C)     Temp Source Oral     SpO2      Weight      Height      Head Circumference      Peak Flow      Pain Score      Pain Loc      Pain Edu?      Excl. in [place]?  No data found.   Updated Vital Signs BP 132/80   Pulse 82   Temp 97 F (36.1 C) (Oral)   Resp 18    Physical Exam  Constitutional: She is oriented to person, place, and time. She appears well-developed and well-nourished.  HENT:  Right Ear: External ear normal.  Left Ear: External ear normal.  Mouth/Throat: Oropharynx is clear and moist.  Eyes: Conjunctivae and EOM are normal. Pupils are equal, round, and reactive to light.  Neck: Normal range of motion. Neck supple.  Pulmonary/Chest: Effort normal.  Abdominal:  No flank tenderness  Musculoskeletal: Normal range of motion.  Neurological: She is alert and oriented to person, place, and time.  Skin: Skin is warm and dry.  Nursing note and vitals reviewed.    UC Treatments / Results  Labs (all labs ordered are listed, but only abnormal results are displayed) Labs Reviewed  POCT URINALYSIS DIP (DEVICE)    EKG  EKG Interpretation None        Radiology No results found.  Procedures Procedures (including critical care time)  Medications Ordered in UC Medications  ciprofloxacin (CIPRO) tablet 500 mg (not administered)     Initial Impression / Assessment and Plan / UC Course  I have reviewed the triage vital signs and the nursing notes.  Pertinent labs & imaging results that were available during my care of the patient were reviewed by me and considered in my medical decision making (see chart for details).     Final Clinical Impressions(s) / UC Diagnoses   Final diagnoses:  Cystitis    New Prescriptions New Prescriptions   CIPROFLOXACIN (CIPRO) 500 MG TABLET    Take 1 tablet (500 mg total) by mouth 2 (two) times daily.     Robyn Haber, MD 01/09/17 [phone]

## 2017-01-09 NOTE — ED Triage Notes (Signed)
Pt here for UTI symptoms

## 2017-01-10 MED FILL — CIPROFLOXACIN HCL 500 MG TA: 500 | 5 days supply | Qty: 10 | Fill #0

## 2017-01-14 MED FILL — CITALOPRAM HBR 20 MG TABLET: 20 | 30 days supply | Qty: 30 | Fill #0

## 2017-01-15 ENCOUNTER — Ambulatory Visit (INDEPENDENT_AMBULATORY_CARE_PROVIDER_SITE_OTHER): Payer: 59 | Admitting: Obstetrics & Gynecology

## 2017-01-15 ENCOUNTER — Encounter: Payer: Self-pay | Admitting: Obstetrics & Gynecology

## 2017-01-15 VITALS — BP 119/76 | HR 83 | Wt 140.0 lb

## 2017-01-15 DIAGNOSIS — Z30431 Encounter for routine checking of intrauterine contraceptive device: Secondary | ICD-10-CM | POA: Diagnosis not present

## 2017-01-15 NOTE — Progress Notes (Signed)
History:  26 y.o. No obstetric history on file. here today for today for IUD string check; Mirena IUD was placed 12/17/16. No complaints about the Mirena, no concerning side effects.  The following portions of the patient's history were reviewed and updated as appropriate: allergies, current medications, past family history, past medical history, past social history, past surgical history and problem list. Last pap smear on 1/4/20156 was normal, neg HRHPV.  Review of Systems:  Pertinent items are noted in HPI.   Objective:  Physical Exam Blood pressure 119/76, pulse 83, weight 140 lb (63.5 kg). Gen: NAD Abd: Soft, nontender and nondistended Pelvic: Normal appearing external genitalia; normal appearing vaginal mucosa and cervix.  IUD strings visualized, about 3-4 cm in length outside cervix.   Assessment & Plan:  Normal IUD check. Patient to keep IUD in place for five years; can come in for removal if she desires pregnancy within the next five years. Routine preventative health maintenance measures emphasized. f/u for PAP in Jan 2019  [place]. Harraway-Smith, M.D., Cherlynn June

## 2017-01-24 DIAGNOSIS — F3340 Major depressive disorder, recurrent, in remission, unspecified: Secondary | ICD-10-CM | POA: Diagnosis not present

## 2017-01-24 DIAGNOSIS — F411 Generalized anxiety disorder: Secondary | ICD-10-CM | POA: Diagnosis not present

## 2017-01-24 DIAGNOSIS — F5112 Insufficient sleep syndrome: Secondary | ICD-10-CM | POA: Diagnosis not present

## 2017-01-24 DIAGNOSIS — F900 Attention-deficit hyperactivity disorder, predominantly inattentive type: Secondary | ICD-10-CM | POA: Diagnosis not present

## 2017-01-27 MED FILL — DEXTROAMP-AMPHETAMIN 20 MG: 20 | 30 days supply | Qty: 60 | Fill #0

## 2017-02-11 DIAGNOSIS — H5203 Hypermetropia, bilateral: Secondary | ICD-10-CM | POA: Diagnosis not present

## 2017-02-14 MED FILL — ALPRAZolam 0.5 MG TABS: 0.5 | 30 days supply | Qty: 30 | Fill #0

## 2017-02-14 MED FILL — OMEPRAZOLE DR 40 MG CAPSULE: 40 | 90 days supply | Qty: 90 | Fill #1

## 2017-02-14 MED FILL — CITALOPRAM HBR 20 MG TABLET: 20 | 30 days supply | Qty: 30 | Fill #0

## 2017-02-18 ENCOUNTER — Telehealth: Payer: 59 | Admitting: Family

## 2017-02-18 ENCOUNTER — Encounter: Payer: Self-pay | Admitting: Physician Assistant

## 2017-02-18 DIAGNOSIS — A499 Bacterial infection, unspecified: Secondary | ICD-10-CM | POA: Diagnosis not present

## 2017-02-18 DIAGNOSIS — N390 Urinary tract infection, site not specified: Secondary | ICD-10-CM

## 2017-02-18 MED ORDER — NITROFURANTOIN MONOHYD MACRO 100 MG PO CAPS: 100.0000 mg | ORAL_CAPSULE | Freq: Two times a day (BID) | ORAL | 0 refills | Status: DC

## 2017-02-18 MED FILL — NITROFURANTOIN MONO-MCR 100: 100 | 5 days supply | Qty: 10 | Fill #0

## 2017-02-18 NOTE — Progress Notes (Signed)

## 2017-02-28 ENCOUNTER — Ambulatory Visit (INDEPENDENT_AMBULATORY_CARE_PROVIDER_SITE_OTHER): Payer: 59 | Admitting: Physician Assistant

## 2017-02-28 ENCOUNTER — Encounter: Payer: Self-pay | Admitting: Physician Assistant

## 2017-02-28 VITALS — BP 104/66 | HR 71 | Temp 98.0°F | Resp 18 | Ht 66.0 in | Wt 135.2 lb

## 2017-02-28 DIAGNOSIS — Z8742 Personal history of other diseases of the female genital tract: Secondary | ICD-10-CM | POA: Insufficient documentation

## 2017-02-28 DIAGNOSIS — Z23 Encounter for immunization: Secondary | ICD-10-CM

## 2017-02-28 DIAGNOSIS — F900 Attention-deficit hyperactivity disorder, predominantly inattentive type: Secondary | ICD-10-CM | POA: Diagnosis not present

## 2017-02-28 DIAGNOSIS — Z114 Encounter for screening for human immunodeficiency virus [HIV]: Secondary | ICD-10-CM | POA: Diagnosis not present

## 2017-02-28 MED ORDER — AMPHETAMINE-DEXTROAMPHET ER 20 MG PO CP24: 20.0000 mg | ORAL_CAPSULE | Freq: Two times a day (BID) | ORAL | 0 refills | Status: DC

## 2017-02-28 MED FILL — ADDERALL XR 20 MG CAP SA: 20 | 30 days supply | Qty: 60 | Fill #0

## 2017-02-28 NOTE — Assessment & Plan Note (Signed)
Stable. Controlled. Continue current treatment. 

## 2017-02-28 NOTE — Progress Notes (Signed)
Patient ID: Kelly Haynes, female    DOB: 05/03/1991, 26 y.o.   MRN: [id number]  PCP: Harrison Mons, PA-C  Chief Complaint  Patient presents with  . Medication Refill    Adderall 20mg      Subjective:   Presents for refill of Adderall, previously prescribed by [facility].  She would like to transition this treatment to me, as there is a higher specialty co-pay at the other clinic.  Since our visit to establish care 11/12/2016, she has been seen by OBGYN and Urgent Care, and had an e-visit. She has a new sexual partner since 11/2016.  Adderall initially prescribed during nursing school. Kept her awake at night. When she switched to night shift, she increased her caffeine, which aggravated her GERD. Nuvigil caused adverse effects, so changed back to Adderall. She is now tolerating it well. No sleep problems. No palpitations, appetite changes. Finds the current dose to be effective.  Review of Systems As above.    Patient Active Problem List   Diagnosis Date Noted  . S/P tonsillectomy 06/16/2015  . Anxiety state 06/16/2015  . Depression 06/16/2015  . Lactose intolerance 06/16/2015  . Gastric polyp 06/16/2015  . IUD (intrauterine device) in place 06/16/2015  . GERD (gastroesophageal reflux disease) 06/13/2015  . Irritable bowel syndrome 01/15/2012     Prior to Admission medications   Medication Sig Start Date End Date Taking? Authorizing Provider  ALPRAZolam Duanne Moron) 0.5 MG tablet Take 0.5 mg by mouth at bedtime as needed for anxiety.   Yes [provider]  amphetamine-dextroamphetamine (ADDERALL XR) 20 MG 24 hr capsule Take 20 mg by mouth 2 (two) times daily.   Yes [provider]  citalopram (CELEXA) 20 MG tablet Take 20 mg by mouth daily.    Yes [provider]  Multiple Vitamins-Minerals (MULTIVITAMIN PO) Take by mouth daily.   Yes [provider]  omeprazole (PRILOSEC) 40 MG capsule Take 1 capsule (40 mg total) by mouth  daily. 11/12/16  Yes Larosa Rhines, PA-C     No Known Allergies     Objective:  Physical Exam  Constitutional: She is oriented to person, place, and time. She appears well-developed and well-nourished. She is active and cooperative. No distress.  BP 104/66   Pulse 71   Temp 98 F (36.7 C) (Oral)   Resp 18   Ht 5\' 6"  (1.676 m)   Wt 135 lb 3.2 oz (61.3 kg)   SpO2 98%   BMI 21.82 kg/m   HENT:  Head: Normocephalic and atraumatic.  Right Ear: Hearing normal.  Left Ear: Hearing normal.  Eyes: Conjunctivae are normal. No scleral icterus.  Neck: Normal range of motion. Neck supple. No thyromegaly present.  Cardiovascular: Normal rate, regular rhythm and normal heart sounds.   Pulses:      Radial pulses are 2+ on the right side, and 2+ on the left side.  Pulmonary/Chest: Effort normal and breath sounds normal.  Lymphadenopathy:       Head (right side): No tonsillar, no preauricular, no posterior auricular and no occipital adenopathy present.       Head (left side): No tonsillar, no preauricular, no posterior auricular and no occipital adenopathy present.    She has no cervical adenopathy.       Right: No supraclavicular adenopathy present.       Left: No supraclavicular adenopathy present.  Neurological: She is alert and oriented to person, place, and time. No sensory deficit.  Skin: Skin is warm,  dry and intact. No rash noted. No cyanosis or erythema. Nails show no clubbing.  Psychiatric: She has a normal mood and affect. Her speech is normal and behavior is normal.       Assessment & Plan:   Problem List Items Addressed This Visit    Attention deficit hyperactivity disorder (ADHD), predominantly inattentive type - Primary    Stable. Controlled. Continue current treatment.      Relevant Medications   amphetamine-dextroamphetamine (ADDERALL XR) 20 MG 24 hr capsule   amphetamine-dextroamphetamine (ADDERALL XR) 20 MG 24 hr capsule   amphetamine-dextroamphetamine (ADDERALL  XR) 20 MG 24 hr capsule    Other Visit Diagnoses    Need for Td vaccine       Relevant Orders   Td vaccine greater than or equal to 7yo preservative free IM (Completed)   Screening for HIV (human immunodeficiency virus)       Relevant Orders   HIV antibody      Return in about 3 months (around 05/31/2017) for re-evaluation of ADHD.   Fara Chute, PA-C [facility]

## 2017-02-28 NOTE — Patient Instructions (Signed)
     IF you received an x-ray today, you will receive an invoice from [facility]. Please contact [facility] at [phone] with questions or concerns regarding your invoice.   IF you received labwork today, you will receive an invoice from [facility]. Please contact [facility] at [phone] with questions or concerns regarding your invoice.   Our billing staff will not be able to assist you with questions regarding bills from these companies.  You will be contacted with the lab results as soon as they are available. The fastest way to get your results is to activate your My Chart account. Instructions are located on the last page of this paperwork. If you have not heard from us regarding the results in 2 weeks, please contact this office.     

## 2017-03-01 LAB — HIV ANTIBODY (ROUTINE TESTING W REFLEX): HIV Screen 4th Generation wRfx: NONREACTIVE

## 2017-03-24 ENCOUNTER — Encounter: Payer: Self-pay | Admitting: Physician Assistant

## 2017-03-31 MED FILL — ADDERALL XR 20 MG CAP SA: 20 | 30 days supply | Qty: 60 | Fill #0

## 2017-05-05 MED FILL — ADDERALL XR 20 MG CAP SA: 20 | 30 days supply | Qty: 60 | Fill #0

## 2017-05-12 MED FILL — OMEPRAZOLE DR 40 MG CAPSULE: 40 | 90 days supply | Qty: 90 | Fill #2

## 2017-05-15 ENCOUNTER — Encounter: Payer: Self-pay | Admitting: Physician Assistant

## 2017-05-19 MED ORDER — ALPRAZOLAM 0.5 MG PO TABS: 0.5000 mg | ORAL_TABLET | Freq: Every evening | ORAL | 0 refills | Status: DC | PRN

## 2017-05-19 MED ORDER — CITALOPRAM HYDROBROMIDE 20 MG PO TABS: 20.0000 mg | ORAL_TABLET | Freq: Every day | ORAL | 3 refills | Status: DC

## 2017-05-19 NOTE — Telephone Encounter (Signed)
Meds ordered this encounter  Medications  . ALPRAZolam (XANAX) 0.5 MG tablet    Sig: Take 1 tablet (0.5 mg total) by mouth at bedtime as needed for anxiety.    Dispense:  30 tablet    Refill:  0    Order Specific Question:   Supervising Provider    Answer:   SHAW, EVA N [[id number]]  . citalopram (CELEXA) 20 MG tablet    Sig: Take 1 tablet (20 mg total) by mouth daily.    Dispense:  90 tablet    Refill:  3    Order Specific Question:   Supervising Provider    Answer:   Brigitte Pulse, EVA N [[id number]]

## 2017-05-20 ENCOUNTER — Other Ambulatory Visit: Payer: Self-pay

## 2017-05-20 ENCOUNTER — Telehealth: Payer: Self-pay

## 2017-05-20 MED FILL — CITALOPRAM HBR 20 MG TABLET: 20 | 90 days supply | Qty: 90 | Fill #0

## 2017-05-20 MED FILL — ALPRAZolam 0.5 MG TABS: 0.5 | 30 days supply | Qty: 30 | Fill #0

## 2017-05-20 NOTE — Telephone Encounter (Signed)
Xanax refilled and faxed to [facility] and received a success notice.

## 2017-05-23 ENCOUNTER — Encounter: Payer: Self-pay | Admitting: Physician Assistant

## 2017-05-23 ENCOUNTER — Ambulatory Visit (INDEPENDENT_AMBULATORY_CARE_PROVIDER_SITE_OTHER): Payer: 59 | Admitting: Physician Assistant

## 2017-05-23 VITALS — BP 109/73 | HR 73 | Temp 98.6°F | Resp 18 | Ht 66.0 in | Wt 133.8 lb

## 2017-05-23 DIAGNOSIS — F900 Attention-deficit hyperactivity disorder, predominantly inattentive type: Secondary | ICD-10-CM | POA: Diagnosis not present

## 2017-05-23 DIAGNOSIS — Z23 Encounter for immunization: Secondary | ICD-10-CM

## 2017-05-23 MED ORDER — ALPRAZOLAM 0.5 MG PO TABS: 0.5000 mg | ORAL_TABLET | Freq: Every evening | ORAL | 0 refills | Status: DC | PRN

## 2017-05-23 MED ORDER — AMPHETAMINE-DEXTROAMPHET ER 20 MG PO CP24: 20.0000 mg | ORAL_CAPSULE | Freq: Two times a day (BID) | ORAL | 0 refills | Status: DC

## 2017-05-23 NOTE — Patient Instructions (Signed)
     IF you received an x-ray today, you will receive an invoice from [facility]. Please contact  Radiology at [phone] with questions or concerns regarding your invoice.   IF you received labwork today, you will receive an invoice from [facility]. Please contact [facility] at [phone] with questions or concerns regarding your invoice.   Our billing staff will not be able to assist you with questions regarding bills from these companies.  You will be contacted with the lab results as soon as they are available. The fastest way to get your results is to activate your My Chart account. Instructions are located on the last page of this paperwork. If you have not heard from us regarding the results in 2 weeks, please contact this office.     

## 2017-05-23 NOTE — Progress Notes (Signed)
Patient ID: Kelly Haynes, female    DOB: [birth year], 26 y.o.   MRN: [id number]  PCP: Harrison Mons, PA-C  Chief Complaint  Patient presents with  . Medication Refill    Adderall XR 20 MG    Subjective:   Presents for evaluation of ADHD and refill of medication.  She relates that she is doing well, without adverse effects. No CP, palpitations, sleep or appetite changes. Good effect of the treatment on attention, concentration.   Review of Systems As above.    Patient Active Problem List   Diagnosis Date Noted  . History of abnormal cervical Pap smear 02/28/2017  . Attention deficit hyperactivity disorder (ADHD), predominantly inattentive type 02/28/2017  . S/P tonsillectomy 06/16/2015  . Anxiety state 06/16/2015  . Depression 06/16/2015  . Lactose intolerance 06/16/2015  . Gastric polyp 06/16/2015  . IUD (intrauterine device) in place 06/16/2015  . GERD (gastroesophageal reflux disease) 06/13/2015  . Irritable bowel syndrome 01/15/2012     Prior to Admission medications   Medication Sig Start Date End Date Taking? Authorizing Provider  ALPRAZolam Duanne Moron) 0.5 MG tablet Take 1 tablet (0.5 mg total) by mouth at bedtime as needed for anxiety. 05/19/17  Yes Henri Baumler, PA-C  amphetamine-dextroamphetamine (ADDERALL XR) 20 MG 24 hr capsule Take 1 capsule (20 mg total) by mouth 2 (two) times daily. 02/28/17  Yes Tonesha Tsou, PA-C  amphetamine-dextroamphetamine (ADDERALL XR) 20 MG 24 hr capsule Take 1 capsule (20 mg total) by mouth 2 (two) times daily. 02/28/17  Yes Sharyn Brilliant, PA-C  amphetamine-dextroamphetamine (ADDERALL XR) 20 MG 24 hr capsule Take 1 capsule (20 mg total) by mouth 2 (two) times daily. 02/28/17  Yes Gerard Cantara, PA-C  citalopram (CELEXA) 20 MG tablet Take 1 tablet (20 mg total) by mouth daily. 05/19/17  Yes Aigner Horseman, PA-C  Multiple Vitamins-Minerals (MULTIVITAMIN PO) Take by mouth daily.   Yes [provider]  omeprazole  (PRILOSEC) 40 MG capsule Take 1 capsule (40 mg total) by mouth daily. 11/12/16  Yes Jakori Burkett, PA-C     No Known Allergies     Objective:  Physical Exam  Constitutional: She is oriented to person, place, and time. She appears well-developed and well-nourished. She is active and cooperative. No distress.  BP 109/73 (BP Location: Right Arm, Patient Position: Sitting, Cuff Size: Normal)   Pulse 73   Temp 98.6 F (37 C) (Oral)   Resp 18   Ht 5\' 6"  (1.676 m)   Wt 133 lb 12.8 oz (60.7 kg)   SpO2 98%   BMI 21.60 kg/m    Eyes: Conjunctivae are normal.  Pulmonary/Chest: Effort normal.  Neurological: She is alert and oriented to person, place, and time.  Psychiatric: She has a normal mood and affect. Her speech is normal and behavior is normal.       Assessment & Plan:   Problem List Items Addressed This Visit    Attention deficit hyperactivity disorder (ADHD), predominantly inattentive type - Primary    Well controlled. Continue current treatment.      Relevant Medications   amphetamine-dextroamphetamine (ADDERALL XR) 20 MG 24 hr capsule   amphetamine-dextroamphetamine (ADDERALL XR) 20 MG 24 hr capsule   ALPRAZolam (XANAX) 0.5 MG tablet    Other Visit Diagnoses    Need for influenza vaccination       Relevant Orders   Flu Vaccine QUAD 36+ mos IM (Completed)   Care order/instruction:       Return in about 6 months (around  11/20/2017) for re-evaluation of ADHD.   Fara Chute, PA-C [facility]

## 2017-06-02 NOTE — Assessment & Plan Note (Signed)
Well controlled. Continue current treatment. 

## 2017-06-24 MED FILL — ADDERALL XR 20 MG CAP SA: 20 | 30 days supply | Qty: 60 | Fill #0

## 2017-07-05 ENCOUNTER — Encounter: Payer: Self-pay | Admitting: Physician Assistant

## 2017-07-22 MED FILL — ADDERALL XR 20 MG CAP SA: 20 | 30 days supply | Qty: 60 | Fill #0

## 2017-07-24 MED FILL — OMEPRAZOLE DR 40 MG CAPSULE: 40 | 90 days supply | Qty: 90 | Fill #3

## 2017-08-12 MED FILL — CITALOPRAM HBR 20 MG TABLET: 20 | 90 days supply | Qty: 90 | Fill #1

## 2017-08-12 MED FILL — ALPRAZolam 0.5 MG TABS: 0.5 | 30 days supply | Qty: 30 | Fill #0

## 2017-09-11 ENCOUNTER — Encounter: Payer: Self-pay | Admitting: Physician Assistant

## 2017-09-11 DIAGNOSIS — F900 Attention-deficit hyperactivity disorder, predominantly inattentive type: Secondary | ICD-10-CM

## 2017-09-12 MED ORDER — AMPHETAMINE-DEXTROAMPHET ER 20 MG PO CP24: 20.0000 mg | ORAL_CAPSULE | Freq: Two times a day (BID) | ORAL | 0 refills | Status: DC

## 2017-09-12 MED ORDER — ALPRAZOLAM 0.5 MG PO TABS: 0.5000 mg | ORAL_TABLET | Freq: Every evening | ORAL | 0 refills | Status: DC | PRN

## 2017-09-12 MED FILL — ALPRAZolam 0.5 MG TABS: 0.5 | 30 days supply | Qty: 30 | Fill #0

## 2017-09-12 MED FILL — ADDERALL XR 20 MG CAP SA: 20 | 30 days supply | Qty: 60 | Fill #0

## 2017-09-12 NOTE — Telephone Encounter (Signed)
Patient notified via My Chart.  Rx sent electronically.  Meds ordered this encounter  Medications  . ALPRAZolam (XANAX) 0.5 MG tablet    Sig: Take 1 tablet (0.5 mg total) by mouth at bedtime as needed for anxiety.    Dispense:  30 tablet    Refill:  0    Order Specific Question:   Supervising Provider    Answer:   SHAW, EVA N [[id number]]  . amphetamine-dextroamphetamine (ADDERALL XR) 20 MG 24 hr capsule    Sig: Take 1 capsule (20 mg total) by mouth 2 (two) times daily.    Dispense:  60 capsule    Refill:  0    Order Specific Question:   Supervising Provider    Answer:   SHAW, EVA N [[id number]]  . amphetamine-dextroamphetamine (ADDERALL XR) 20 MG 24 hr capsule    Sig: Take 1 capsule (20 mg total) by mouth 2 (two) times daily.    Dispense:  60 capsule    Refill:  0    May fill 30 days after date on prescription    Order Specific Question:   Supervising Provider    Answer:   Brigitte Pulse, EVA N [[id number]]  . amphetamine-dextroamphetamine (ADDERALL XR) 20 MG 24 hr capsule    Sig: Take 1 capsule (20 mg total) by mouth 2 (two) times daily.    Dispense:  60 capsule    Refill:  0    May fill 60 days after date on prescription.    Order Specific Question:   Supervising Provider    Answer:   Shawnee Knapp [phone]

## 2017-10-21 ENCOUNTER — Encounter: Payer: Self-pay | Admitting: Physician Assistant

## 2017-10-21 NOTE — Telephone Encounter (Signed)
Please print patient's medication list and fax to: Di Kindle, [facility] [place] [phone]  [address] [place], [place] [address] Fax [phone]  She has an appointment there on 10/28/2017

## 2017-11-26 LAB — HIV ANTIBODY (ROUTINE TESTING W REFLEX): HIV 1&2 Ab, 4th Generation: NONREACTIVE

## 2017-11-26 LAB — HM PAP SMEAR: HM Pap smear: NORMAL

## 2017-12-25 ENCOUNTER — Encounter: Payer: Self-pay | Admitting: Physician Assistant

## 2019-12-21 ENCOUNTER — Encounter: Payer: Self-pay | Admitting: Gastroenterology

## 2019-12-21 ENCOUNTER — Other Ambulatory Visit: Payer: Self-pay

## 2019-12-21 ENCOUNTER — Encounter: Payer: Self-pay | Admitting: Medical-Surgical

## 2019-12-21 ENCOUNTER — Ambulatory Visit (INDEPENDENT_AMBULATORY_CARE_PROVIDER_SITE_OTHER): Payer: No Typology Code available for payment source | Admitting: Medical-Surgical

## 2019-12-21 VITALS — BP 108/74 | HR 100 | Temp 98.3°F | Ht 65.5 in | Wt 158.2 lb

## 2019-12-21 DIAGNOSIS — F900 Attention-deficit hyperactivity disorder, predominantly inattentive type: Secondary | ICD-10-CM

## 2019-12-21 DIAGNOSIS — F329 Major depressive disorder, single episode, unspecified: Secondary | ICD-10-CM

## 2019-12-21 DIAGNOSIS — L989 Disorder of the skin and subcutaneous tissue, unspecified: Secondary | ICD-10-CM

## 2019-12-21 DIAGNOSIS — K219 Gastro-esophageal reflux disease without esophagitis: Secondary | ICD-10-CM

## 2019-12-21 DIAGNOSIS — M549 Dorsalgia, unspecified: Secondary | ICD-10-CM

## 2019-12-21 DIAGNOSIS — F411 Generalized anxiety disorder: Secondary | ICD-10-CM | POA: Diagnosis not present

## 2019-12-21 DIAGNOSIS — F32A Depression, unspecified: Secondary | ICD-10-CM

## 2019-12-21 DIAGNOSIS — M542 Cervicalgia: Secondary | ICD-10-CM

## 2019-12-21 MED ORDER — PANTOPRAZOLE SODIUM 40 MG PO TBEC: 40.0000 mg | DELAYED_RELEASE_TABLET | Freq: Every day | ORAL | 3 refills | Status: DC

## 2019-12-21 NOTE — Progress Notes (Signed)
New Patient Office Visit  Subjective:  Patient ID: Kelly Haynes, female    DOB: 08-12-1991  Age: 29 y.o. MRN: [id number]  CC:  Chief Complaint  Patient presents with  . Establish Care    HPI [name] presents to establish care.  ADHD-managed by [facility] with Adderall.    OBGYN for paps, iud in place 2019 replaced   Depression/anxiety-managed by [employer] with Lexapro daily.  Denies SI/HI.  Feels this is well controlled.   GERD-reports experiencing bad acid reflux for many years.  History of vomiting almost every morning.  Had endoscopy and everything looked okay.  A polyp was found in her husband upper intestines but that turned out to be fine.  Has been taking omeprazole 40 mg daily for several years but her symptoms are not controlled.  Has made many dietary changes including no alcohol, limiting coffee to 1 cup/day, avoiding spicy foods, etc.  Reflux is now waking her at night with discomfort.  Right upper quadrant and epigastric pain at least 5 out of 7 days/week.  Reports pain is burning and it extends of the chest and sometimes to her throat causing jaw pain.  Has started using Tums in addition to omeprazole, taking 2 Tums at night followed by a glass of almond milk.  Approximately once a week she sees bright red blood in small amounts on toilet paper and sometimes in the toilet.  heartburn, bad acid reflux for many years, used to throw up every am. Had endoscopy and everything looked ok, polyp in upper intestine but that was fine. Omeprazole 40mg  for several years but s/s really bad. No alcohol, 1 cup coffee daily, no spicy, made dietary changes. Waking at night. RUQ pain 5 of 7 days, epigastric burning, at night chest burning with jaw pain. Using tums at night. Drinks a glass of almond milk at bedtime. Once weekly, sees bright red blood in stool/on toilet paper.  Daily bowel movements but occasionally has hard stools.  Eating prunes daily and  drinking plenty of water to stay hydrated.   Back/neck/muscle pain-patient reports mom found a small lump on her back that is uncomfortable at times.  Overall general back and neck pain.  Reports carrying around her 22-1/2-year-old little girl frequently.  Uses ibuprofen as needed which helps.  Went to a prior Pensions consultant for approximately 1 month but found this to be too expensive and inconvenient.  Uses ice when the pain gets bad.  Also reports mom's arthritis cream helps sometimes.   Skin lesions-would like to see a dermatologist as she has some skin issues that are acting up.  Reports she has some bumps that have come up not only on her face but also in generalized areas on her body. Past Medical History:  Diagnosis Date  . Abnormal Pap smear of cervix   . Anxiety state 06/16/2015  . Depression 06/16/2015  . GERD (gastroesophageal reflux disease) 06/13/2015  . Lactose intolerance 06/16/2015    Past Surgical History:  Procedure Laterality Date  . COCCYX REMOVAL    . TONSILLECTOMY  06/16/2015  . WISDOM TOOTH EXTRACTION      Family History  Problem Relation Age of Onset  . Depression Father   . Depression Sister   . Hypertension Maternal Grandfather   . Hypertension Paternal Grandfather   . Depression Cousin     Social History   Socioeconomic History  . Marital status: Single    Spouse name: Not on file  . Number of  children: 0  . Years of education: BSN  . Highest education level: Not on file  Occupational History  . Occupation: Management consultant    Comment: [employer]  Tobacco Use  . Smoking status: Never Smoker  . Smokeless tobacco: Never Used  Substance and Sexual Activity  . Alcohol use: Not Currently  . Drug use: Never  . Sexual activity: Not Currently    Partners: Male    Birth control/protection: I.U.D.  Other Topics Concern  . Not on file  Social History Narrative   Lives with a roommate, also a Marine scientist.   Mother lives in [place], [place].   Father lives in [place], [place].    Smoke detectors in home? Yes    Guns in home? No    Consistent seatbelt use? Yes    Consistent dental brushing and flossing? Yes    Semi-Annual dental visits: Yes    Annual Eye exams: last visit 01/2017   Social Determinants of Health   Financial Resource Strain:   . Difficulty of Paying Living Expenses:   Food Insecurity:   . Worried About Charity fundraiser in the Last Year:   . Arboriculturist in the Last Year:   Transportation Needs:   . Film/video editor (Medical):   Marland Kitchen Lack of Transportation (Non-Medical):   Physical Activity:   . Days of Exercise per Week:   . Minutes of Exercise per Session:   Stress:   . Feeling of Stress :   Social Connections:   . Frequency of Communication with Friends and Family:   . Frequency of Social Gatherings with Friends and Family:   . Attends Religious Services:   . Active Member of Clubs or Organizations:   . Attends Archivist Meetings:   Marland Kitchen Marital Status:   Intimate Partner Violence:   . Fear of Current or Ex-Partner:   . Emotionally Abused:   Marland Kitchen Physically Abused:   . Sexually Abused:     ROS Review of Systems  Constitutional: Negative for chills, fatigue, fever and unexpected weight change.  Respiratory: Negative for cough, chest tightness and shortness of breath.   Cardiovascular: Negative for chest pain, palpitations and leg swelling.  Gastrointestinal: Positive for abdominal pain, blood in stool, constipation and nausea.  Genitourinary: Negative for dysuria, frequency, hematuria and urgency.  Musculoskeletal: Positive for back pain, myalgias and neck pain.  Allergic/Immunologic: Negative for environmental allergies.  Neurological: Negative for dizziness, light-headedness and headaches.  Hematological: Does not bruise/bleed easily.  Psychiatric/Behavioral: Positive for sleep disturbance. Negative for self-injury and suicidal ideas.    Objective:   Today's Vitals: BP 108/74   Pulse 100   Temp 98.3 F (36.8  C) (Oral)   Ht 5' 5.5" (1.664 m)   Wt 158 lb 3.2 oz (71.8 kg)   LMP 11/23/2019   SpO2 98%   BMI 25.93 kg/m   Physical Exam Vitals reviewed.  Constitutional:      General: She is not in acute distress.    Appearance: Normal appearance.  HENT:     Head: Normocephalic and atraumatic.  Cardiovascular:     Rate and Rhythm: Normal rate and regular rhythm.     Pulses: Normal pulses.     Heart sounds: Normal heart sounds. No murmur. No friction rub. No gallop.   Pulmonary:     Effort: Pulmonary effort is normal. No respiratory distress.     Breath sounds: Normal breath sounds. No wheezing.  Abdominal:     General:  Abdomen is flat. Bowel sounds are normal.     Palpations: Abdomen is soft.  Musculoskeletal:     Comments: Unable to locate lump on back in the area that patient indicated.  She says sometimes it is difficult to find unless she is in certain positions.  Skin:    General: Skin is warm and dry.  Neurological:     Mental Status: She is alert and oriented to person, place, and time.  Psychiatric:        Mood and Affect: Mood normal.        Behavior: Behavior normal.        Thought Content: Thought content normal.        Judgment: Judgment normal.     Assessment & Plan:   1. Gastroesophageal reflux disease, unspecified whether esophagitis present Switching from omeprazole to pantoprazole 40 mg daily.  Discussed TIF procedure, patient interested in finding out more.  Referral to GI to talk to Dr. Bryan Lemma about procedure and see if she is a good candidate. - Ambulatory referral to Gastroenterology - pantoprazole (PROTONIX) 40 MG tablet; Take 1 tablet (40 mg total) by mouth daily.  Dispense: 30 tablet; Refill: 3  2. Generalized skin lesions Patient request referral to dermatology for overall skin survey and to address skin concerns. - Ambulatory referral to Dermatology  3. Depression, unspecified depression type Stable.  Managed by [employer].  4.  Anxiety state Stable.  Managed by [employer].  5. Back pain, unspecified back location, unspecified back pain laterality, unspecified chronicity Continue conservative treatment with heat, ice, massage, gentle stretching, Tylenol, and ibuprofen.  6. Attention deficit hyperactivity disorder (ADHD), predominantly inattentive type Stable.  Managed by [employer].  7. Neck pain Continue conservative treatment as described above.  Reviewed good body mechanics with carrying her toddler as well as well working.  Outpatient Encounter Medications as of 12/21/2019  Medication Sig  . amphetamine-dextroamphetamine (ADDERALL) 10 MG tablet Take 10 mg by mouth daily in the afternoon. In the afternoon  . escitalopram (LEXAPRO) 10 MG tablet Take 10 mg by mouth daily.  Marland Kitchen levonorgestrel (MIRENA) 20 MCG/24HR IUD by Intrauterine route.  . Multiple Vitamins-Minerals (MULTIVITAMIN PO) Take by mouth daily.  Marland Kitchen omeprazole (PRILOSEC) 40 MG capsule Take 1 capsule (40 mg total) by mouth daily.  . [DISCONTINUED] amphetamine-dextroamphetamine (ADDERALL XR) 20 MG 24 hr capsule Take 1 capsule (20 mg total) by mouth 2 (two) times daily. (Patient taking differently: Take 20 mg by mouth in the morning. )  . ALPRAZolam (XANAX) 0.5 MG tablet Take 1 tablet (0.5 mg total) by mouth at bedtime as needed for anxiety. (Patient not taking: Reported on 12/21/2019)  . amphetamine-dextroamphetamine (ADDERALL XR) 20 MG 24 hr capsule Take 1 capsule (20 mg total) by mouth 2 (two) times daily. (Patient not taking: Reported on 12/21/2019)  . citalopram (CELEXA) 20 MG tablet Take 1 tablet (20 mg total) by mouth daily. (Patient not taking: Reported on 12/21/2019)  . pantoprazole (PROTONIX) 40 MG tablet Take 1 tablet (40 mg total) by mouth daily.  . [DISCONTINUED] amphetamine-dextroamphetamine (ADDERALL XR) 20 MG 24 hr capsule Take 1 capsule (20 mg total) by mouth 2 (two) times daily. (Patient not taking: Reported on  12/21/2019)   No facility-administered encounter medications on file as of 12/21/2019.    Follow-up: Return in about 6 months (around 06/22/2020) for GERD follow up.   Clearnce Sorrel, DNP, APRN, FNP-BC [facility]

## 2020-01-25 ENCOUNTER — Other Ambulatory Visit: Payer: Self-pay | Admitting: Gastroenterology

## 2020-01-25 ENCOUNTER — Ambulatory Visit: Payer: No Typology Code available for payment source | Admitting: Gastroenterology

## 2020-01-25 ENCOUNTER — Encounter: Payer: Self-pay | Admitting: Gastroenterology

## 2020-01-25 VITALS — BP 104/70 | HR 72 | Temp 98.1°F | Ht 66.0 in | Wt 150.0 lb

## 2020-01-25 DIAGNOSIS — K219 Gastro-esophageal reflux disease without esophagitis: Secondary | ICD-10-CM | POA: Diagnosis not present

## 2020-01-25 MED ORDER — FAMOTIDINE 40 MG PO TABS: 40.0000 mg | ORAL_TABLET | Freq: Every evening | ORAL | 11 refills | Status: DC

## 2020-01-25 MED FILL — PANTOPRAZOLE SOD DR 40 MG T: 40 | 30 days supply | Qty: 30 | Fill #0

## 2020-01-25 MED FILL — FAMOTIDINE 40 MG TABS: 40 | 30 days supply | Qty: 30 | Fill #0

## 2020-01-25 NOTE — Patient Instructions (Signed)
Stay on pantoprazole one tablet by mouth every morning.   We have sent the following medications to your pharmacy for you to pick up at your convenience: famotidine 40 mg one tablet by mouth every evening.   Patient advised to avoid spicy, acidic, citrus, chocolate, mints, fruit and fruit juices.  Limit the intake of caffeine, alcohol and Soda.  Don't exercise too soon after eating.  Don't lie down within 3-4 hours of eating.  Elevate the head of your bed by using bed blocks. You can purchase them on [employer] or medical supply store.  You have been scheduled for an endoscopy. Please follow written instructions given to you at your visit today. If you use inhalers (even only as needed), please bring them with you on the day of your procedure.  Thank you for choosing me and [facility].  Pricilla Riffle. Dagoberto Ligas., MD., [facility]al

## 2020-01-25 NOTE — Progress Notes (Signed)
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 She has been treated for GERD since around age 67 or 28. Previously evaluated by Dr. Carlton Adam at [facility] in 2014 and underwent EGD showing polyps in her intestine, otherwise normal per her report. She she has frequent heartburn and frequent nocturnal heartburn.  She was started on omeprazole 20 mg once daily which was eventually increased to 40 mg once daily.  Her PCP advised her to change to pantoprazole 40 mg daily which she started a few days ago.  She has made a substantial effort to modify her diet to avoid foods and beverages that stress her reflux symptoms however she has been unable to discontinue medications.  The last time she tried to discontinue omeprazole, about 1 year ago, she had substantial symptoms and resumed daily usage.  She notes occasional mild epigastric burning when her reflux symptoms are active however primarily her symptoms are heartburn.  She states she has a hemorrhoid which causes infrequent small amounts of rectal bleeding and this was examined by her OB/GYN within the past year.  She has a 66 month old baby girl who is healthy and doing very well.  No other gastrointestinal complaints.  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  She previously worked at [employer] and [employer].  Denies weight loss, constipation, diarrhea, change in stool caliber, melena, nausea, vomiting, dysphagia, chest pain.    No Known Allergies Outpatient Medications Prior to Visit  Medication Sig Dispense Refill  . amphetamine-dextroamphetamine (ADDERALL XR) 20 MG 24 hr capsule Take 1 capsule (20 mg total) by mouth 2 (two) times daily. (Patient taking differently: Take 20 mg by mouth daily. ) 60 capsule 0  . amphetamine-dextroamphetamine (ADDERALL) 10 MG tablet Take 10 mg by mouth daily in the afternoon. In the afternoon    . escitalopram (LEXAPRO) 10 MG tablet Take 10 mg by mouth daily.    Marland Kitchen  levonorgestrel (MIRENA) 20 MCG/24HR IUD by Intrauterine route.    . Multiple Vitamins-Minerals (MULTIVITAMIN PO) Take by mouth daily.    Marland Kitchen omeprazole (PRILOSEC) 40 MG capsule Take 1 capsule (40 mg total) by mouth daily. 90 capsule 3  . pantoprazole (PROTONIX) 40 MG tablet Take 1 tablet (40 mg total) by mouth daily. (Patient not taking: Reported on 01/25/2020) 30 tablet 3   No facility-administered medications prior to visit.   Past Medical History:  Diagnosis Date  . Abnormal Pap smear of cervix   . Anxiety state 06/16/2015  . Depression 06/16/2015  . GERD (gastroesophageal reflux disease) 06/13/2015  . Lactose intolerance 06/16/2015   Past Surgical History:  Procedure Laterality Date  . COCCYX REMOVAL    . TONSILLECTOMY  06/16/2015  . WISDOM TOOTH EXTRACTION     Social History   Socioeconomic History  . Marital status: Single    Spouse name: Not on file  . Number of children: 0  . Years of education: BSN  . Highest education level: Not on file  Occupational History  . Occupation: Management consultant    Comment: [employer]  Tobacco Use  . Smoking status: Never Smoker  . Smokeless tobacco: Never Used  Substance and Sexual Activity  . Alcohol use: Not Currently  . Drug use: Never  . Sexual activity: Not Currently    Partners: Male    Birth control/protection: I.U.D.  Other Topics Concern  . Not on file  Social History Narrative  Lives with a roommate, also a Marine scientist.   Mother lives in [place], [place].   Father lives in [place], [place].   Smoke detectors in home? Yes    Guns in home? No    Consistent seatbelt use? Yes    Consistent dental brushing and flossing? Yes    Semi-Annual dental visits: Yes    Annual Eye exams: last visit 01/2017   Social Determinants of Health   Financial Resource Strain:   . Difficulty of Paying Living Expenses:   Food Insecurity:   . Worried About Charity fundraiser in the Last Year:   . Arboriculturist in the Last Year:   Transportation Needs:   . Consulting civil engineer (Medical):   Marland Kitchen Lack of Transportation (Non-Medical):   Physical Activity:   . Days of Exercise per Week:   . Minutes of Exercise per Session:   Stress:   . Feeling of Stress :   Social Connections:   . Frequency of Communication with Friends and Family:   . Frequency of Social Gatherings with Friends and Family:   . Attends Religious Services:   . Active Member of Clubs or Organizations:   . Attends Archivist Meetings:   Marland Kitchen Marital Status:    Family History  Problem Relation Age of Onset  . Depression Father   . Depression Sister   . Hypertension Maternal Grandfather   . Hypertension Paternal Grandfather   . Depression Cousin       Review of Systems: Pertinent positive and negative review of systems were noted in the above HPI section. All other review of systems were otherwise negative.    Physical Exam: General: Well developed, well nourished, no acute distress Head: Normocephalic and atraumatic Eyes:  sclerae anicteric, EOMI Ears: Normal auditory acuity Mouth: Not examined, mask on during Covid-19 pandemic Neck: Supple, no masses or thyromegaly Lungs: Clear throughout to auscultation Heart: Regular rate and rhythm; no murmurs, rubs or bruits Abdomen: Soft, non tender and non distended. No masses, hepatosplenomegaly or hernias noted. Normal Bowel sounds Rectal: Not done Musculoskeletal: Symmetrical with no gross deformities  Skin: No lesions on visible extremities Pulses:  Normal pulses noted Extremities: No clubbing, cyanosis, edema or deformities noted Neurological: Alert oriented x 4, grossly nonfocal Cervical Nodes:  No significant cervical adenopathy Inguinal Nodes: No significant inguinal adenopathy Psychological:  Alert and cooperative. Normal mood and affect   Assessment and Recommendations:  1. GERD.  Rule out esophagitis, Barrett's.  Intensify antireflux measures.  Begin the use of 4 inch bed blocks for long-term use.  Change  to pantoprazole 40 mg daily as recommended by her PCP.  Add famotidine 40 mg at bedtime.  Schedule EGD. The risks (including bleeding, perforation, infection, missed lesions, medication reactions and possible hospitalization or surgery if complications occur), benefits, and alternatives to endoscopy with possible biopsy and possible dilation were discussed with the patient and they consent to proceed.   2.  Hemorrhoid infrequently symptomatic by report.  Evaluated by her OB/GYN.  If symptoms persist or worsen she is advised to schedule an office appointment with me for further evaluation.  3. Lactose intolerance.  She effectively avoids lactose products.    cc: Samuel Bouche, [place] [address] [place],  [place] [address]

## 2020-01-31 ENCOUNTER — Encounter: Payer: Self-pay | Admitting: Gastroenterology

## 2020-01-31 ENCOUNTER — Other Ambulatory Visit: Payer: Self-pay

## 2020-01-31 ENCOUNTER — Ambulatory Visit (AMBULATORY_SURGERY_CENTER): Payer: No Typology Code available for payment source | Admitting: Gastroenterology

## 2020-01-31 VITALS — BP 109/71 | HR 73 | Temp 96.9°F | Resp 22 | Ht 66.0 in | Wt 150.0 lb

## 2020-01-31 DIAGNOSIS — K2970 Gastritis, unspecified, without bleeding: Secondary | ICD-10-CM

## 2020-01-31 DIAGNOSIS — K3189 Other diseases of stomach and duodenum: Secondary | ICD-10-CM | POA: Diagnosis not present

## 2020-01-31 DIAGNOSIS — K317 Polyp of stomach and duodenum: Secondary | ICD-10-CM

## 2020-01-31 DIAGNOSIS — K219 Gastro-esophageal reflux disease without esophagitis: Secondary | ICD-10-CM

## 2020-01-31 DIAGNOSIS — K259 Gastric ulcer, unspecified as acute or chronic, without hemorrhage or perforation: Secondary | ICD-10-CM

## 2020-01-31 MED ORDER — SODIUM CHLORIDE 0.9 % IV SOLN: 500.0000 mL | Freq: Once | INTRAVENOUS | Status: DC

## 2020-01-31 NOTE — Progress Notes (Signed)
Temp check by:LS Vital check by:DT  The medical and surgical history was reviewed and verified with the patient.

## 2020-01-31 NOTE — Patient Instructions (Signed)
Please read handouts provided. Continue present medications. Await pathology results. Follow antireflux measures.     YOU HAD AN ENDOSCOPIC PROCEDURE TODAY AT [facility]:   Refer to the procedure report that was given to you for any specific questions about what was found during the examination.  If the procedure report does not answer your questions, please call your gastroenterologist to clarify.  If you requested that your care partner not be given the details of your procedure findings, then the procedure report has been included in a sealed envelope for you to review at your convenience later.  YOU SHOULD EXPECT: Some feelings of bloating in the abdomen. Passage of more gas than usual.  Walking can help get rid of the air that was put into your GI tract during the procedure and reduce the bloating. If you had a lower endoscopy (such as a colonoscopy or flexible sigmoidoscopy) you may notice spotting of blood in your stool or on the toilet paper. If you underwent a bowel prep for your procedure, you may not have a normal bowel movement for a few days.  Please Note:  You might notice some irritation and congestion in your nose or some drainage.  This is from the oxygen used during your procedure.  There is no need for concern and it should clear up in a day or so.  SYMPTOMS TO REPORT IMMEDIATELY:   Following upper endoscopy (EGD)  Vomiting of blood or coffee ground material  New chest pain or pain under the shoulder blades  Painful or persistently difficult swallowing  New shortness of breath  Fever of 100F or higher  Black, tarry-looking stools  For urgent or emergent issues, a gastroenterologist can be reached at any hour by calling [phone]. Do not use MyChart messaging for urgent concerns.    DIET:  We do recommend a small meal at first, but then you may proceed to your regular diet.  Drink plenty of fluids but you should avoid alcoholic beverages for 24  hours.  ACTIVITY:  You should plan to take it easy for the rest of today and you should NOT DRIVE or use heavy machinery until tomorrow (because of the sedation medicines used during the test).    FOLLOW UP: Our staff will call the number listed on your records 48-72 hours following your procedure to check on you and address any questions or concerns that you may have regarding the information given to you following your procedure. If we do not reach you, we will leave a message.  We will attempt to reach you two times.  During this call, we will ask if you have developed any symptoms of COVID 19. If you develop any symptoms (ie: fever, flu-like symptoms, shortness of breath, cough etc.) before then, please call [phone].  If you test positive for Covid 19 in the 2 weeks post procedure, please call and report this information to [place].    If any biopsies were taken you will be contacted by phone or by letter within the next 1-3 weeks.  Please call us at [phone] if you have not heard about the biopsies in 3 weeks.    SIGNATURES/CONFIDENTIALITY: You and/or your care partner have signed paperwork which will be entered into your electronic medical record.  These signatures attest to the fact that that the information above on your After Visit Summary has been reviewed and is understood.  Full responsibility of the confidentiality of this discharge information lies with you  and/or your care-partner. 

## 2020-01-31 NOTE — Op Note (Signed)
[place] Patient Name: Kelly Haynes Procedure Date: 01/31/2020 3:28 PM MRN: [id number] Endoscopist: Ladene Artist , MD Age: 29 Referring MD:  Date of Birth: 17-Jul-1991 Gender: Female Account #: [id number] Procedure:                Upper GI endoscopy Indications:              Gastroesophageal reflux disease Medicines:                Monitored Anesthesia Care Procedure:                Pre-Anesthesia Assessment:                           - Prior to the procedure, a History and Physical                            was performed, and patient medications and                            allergies were reviewed. The patient's tolerance of                            previous anesthesia was also reviewed. The risks                            and benefits of the procedure and the sedation                            options and risks were discussed with the patient.                            All questions were answered, and informed consent                            was obtained. Prior Anticoagulants: The patient has                            taken no previous anticoagulant or antiplatelet                            agents. ASA Grade Assessment: II - A patient with                            mild systemic disease. After reviewing the risks                            and benefits, the patient was deemed in                            satisfactory condition to undergo the procedure.                           After obtaining informed consent, the endoscope was  passed under direct vision. Throughout the                            procedure, the patient's blood pressure, pulse, and                            oxygen saturations were monitored continuously. The                            Endoscope was introduced through the mouth, and                            advanced to the second part of duodenum. The upper                            GI endoscopy was  accomplished without difficulty.                            The patient tolerated the procedure well. Scope In: Scope Out: Findings:                 The examined esophagus was normal. Z-line at 38 cm.                           Multiple localized medium erosions and one nodular                            erosion with no bleeding and no stigmata of recent                            bleeding were found in the gastric antrum. Biopsies                            were taken with a cold forceps for histology.                           Multiple 3 to 4 mm sessile polyps with no bleeding                            and no stigmata of recent bleeding were found in                            the gastric fundus and in the gastric body.                            Biopsies were taken with a cold forceps for                            histology.                           The exam of the stomach was otherwise normal.  The duodenal bulb and second portion of the                            duodenum were normal. Complications:            No immediate complications. Estimated Blood Loss:     Estimated blood loss was minimal. Impression:               - Normal esophagus.                           - Erosive gastropathy with no bleeding and no                            stigmata of recent bleeding. Biopsied.                           - Multiple gastric polyps. Biopsied.                           - Normal duodenal bulb and second portion of the                            duodenum. Recommendation:           - Patient has a contact number available for                            emergencies. The signs and symptoms of potential                            delayed complications were discussed with the                            patient. Return to normal activities tomorrow.                            Written discharge instructions were provided to the                            patient.                            - Resume previous diet.                           - Follow antireflux measures.                           - Continue present medications.                           - Await pathology results. Ladene Artist, MD 01/31/2020 3:46:08 PM This report has been signed electronically.

## 2020-01-31 NOTE — Progress Notes (Signed)
A and O x3. Report to RN. Tolerated MAC anesthesia well.Teeth unchanged after procedure.

## 2020-02-02 ENCOUNTER — Telehealth: Payer: Self-pay | Admitting: *Deleted

## 2020-02-02 NOTE — Telephone Encounter (Signed)
First attempt, left VM.  

## 2020-02-02 NOTE — Telephone Encounter (Signed)
Second attempt, left VM.  

## 2020-02-08 ENCOUNTER — Encounter: Payer: Self-pay | Admitting: Gastroenterology

## 2020-02-08 MED FILL — AMPHETAMINE-DEXTROAMPHETAMI: 10 | 30 days supply | Qty: 30 | Fill #0

## 2020-02-08 MED FILL — ADDERALL XR 20 MG CAP SA: 20 | 30 days supply | Qty: 30 | Fill #0

## 2020-02-15 ENCOUNTER — Encounter: Payer: Self-pay | Admitting: Medical-Surgical

## 2020-02-24 ENCOUNTER — Other Ambulatory Visit: Payer: Self-pay

## 2020-02-24 ENCOUNTER — Ambulatory Visit (INDEPENDENT_AMBULATORY_CARE_PROVIDER_SITE_OTHER): Payer: No Typology Code available for payment source | Admitting: Physician Assistant

## 2020-02-24 ENCOUNTER — Encounter: Payer: Self-pay | Admitting: Physician Assistant

## 2020-02-24 DIAGNOSIS — L700 Acne vulgaris: Secondary | ICD-10-CM | POA: Diagnosis not present

## 2020-02-24 DIAGNOSIS — D225 Melanocytic nevi of trunk: Secondary | ICD-10-CM | POA: Diagnosis not present

## 2020-02-24 DIAGNOSIS — D229 Melanocytic nevi, unspecified: Secondary | ICD-10-CM

## 2020-02-24 MED ORDER — AMZEEQ 4 % EX FOAM: 1.0000 "application " | Freq: Every day | CUTANEOUS | 2 refills | Status: DC

## 2020-02-24 MED FILL — FAMOTIDINE 40 MG TABLET: 40 | 30 days supply | Qty: 30 | Fill #0

## 2020-02-24 MED FILL — PANTOPRAZOLE SOD DR 40 MG T: 40 | 30 days supply | Qty: 30 | Fill #0

## 2020-02-24 NOTE — Progress Notes (Signed)
   New Patient Visit  Subjective  Kelly Haynes is a 29 y.o. female who presents for the following: Acne (Has gotten worse over the last few months. Did take isotretinoin 2013. She is not using anything right now just otc face clenser. ) and Skin Problem (Wants a spot checked on patients back. She thinks its fine but wants checked.). On the isotretinoin she did clear and it had done while for a period of time after that.  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 She has acne on her face and buttocks but not on her chest or back. Face tends to be combo. Prior to accutane she was on doxycycline and various topicals. Has been a steady problem for 6-8 months. She has a couple moles she wants looked at.   Objective  Well appearing patient in no apparent distress; mood and affect are within normal limits.  Face examined. Relevant physical exam findings are noted in the Assessment and Plan. No suspicious moles noted on back.  Objective  Head - Anterior (Face), Left Hip (side) - Posterior, Right Hip (side) - Posterior: Follicularly based papules and pustules with comedones   Objective  Right Lower Back: Tan-brown symmetric macules and papules.   Assessment & Plan  Acne vulgaris (3) Head - Anterior (Face); Left Hip (side) - Posterior; Right Hip (side) - Posterior  Minocycline HCl Micronized (AMZEEQ) 4 % FOAM - Head - Anterior (Face)  Nevus Right Lower Back

## 2020-02-29 ENCOUNTER — Telehealth: Payer: Self-pay | Admitting: *Deleted

## 2020-02-29 NOTE — Telephone Encounter (Signed)
Returned call to schedule appointment, left patient a message that office will call back tomorrow to schedule due to phones forwarding at 4:45 PM for close of business.

## 2020-03-01 ENCOUNTER — Telehealth: Payer: Self-pay | Admitting: *Deleted

## 2020-03-01 NOTE — Telephone Encounter (Signed)
Called patient to schedule appointment, not able to leave a message due to mailbox being full.

## 2020-03-09 MED FILL — AMPHETAMINE-DEXTROAMPHETAMI: 10 | 30 days supply | Qty: 30 | Fill #0

## 2020-03-09 MED FILL — ADDERALL XR 20 MG CAP SA: 20 | 30 days supply | Qty: 30 | Fill #0

## 2020-03-23 MED FILL — FAMOTIDINE 40 MG TABLET: 40 | 30 days supply | Qty: 30 | Fill #1

## 2020-03-23 MED FILL — PANTOPRAZOLE SOD DR 40 MG T: 40 | 30 days supply | Qty: 30 | Fill #1

## 2020-03-30 ENCOUNTER — Other Ambulatory Visit: Payer: Self-pay

## 2020-03-30 ENCOUNTER — Other Ambulatory Visit (HOSPITAL_COMMUNITY)
Admission: RE | Admit: 2020-03-30 | Discharge: 2020-03-30 | Disposition: A | Discharge status: Home / Self Care | Payer: No Typology Code available for payment source | Source: Ambulatory Visit | Attending: Obstetrics and Gynecology | Admitting: Obstetrics and Gynecology

## 2020-03-30 ENCOUNTER — Encounter: Payer: Self-pay | Admitting: Obstetrics and Gynecology

## 2020-03-30 ENCOUNTER — Ambulatory Visit (INDEPENDENT_AMBULATORY_CARE_PROVIDER_SITE_OTHER): Payer: No Typology Code available for payment source | Admitting: Obstetrics and Gynecology

## 2020-03-30 VITALS — BP 106/67 | HR 96 | Resp 16 | Ht 66.0 in | Wt 147.0 lb

## 2020-03-30 DIAGNOSIS — Z113 Encounter for screening for infections with a predominantly sexual mode of transmission: Secondary | ICD-10-CM

## 2020-03-30 DIAGNOSIS — Z975 Presence of (intrauterine) contraceptive device: Secondary | ICD-10-CM

## 2020-03-30 DIAGNOSIS — Z01419 Encounter for gynecological examination (general) (routine) without abnormal findings: Secondary | ICD-10-CM | POA: Diagnosis not present

## 2020-03-30 DIAGNOSIS — K625 Hemorrhage of anus and rectum: Secondary | ICD-10-CM | POA: Diagnosis not present

## 2020-03-30 DIAGNOSIS — Z124 Encounter for screening for malignant neoplasm of cervix: Secondary | ICD-10-CM | POA: Insufficient documentation

## 2020-03-30 NOTE — Progress Notes (Signed)
GYNECOLOGY ANNUAL PREVENTATIVE CARE ENCOUNTER NOTE  Subjective:   Kelly Haynes is a 29 y.o. G1P1 female here for a annual gynecologic exam. Current complaints: hemorrhoid, bright red bleeding with stool once per week.  Has Mirena in place, has light periods, regular.   Denies abnormal vaginal bleeding, discharge, pelvic pain, problems with intercourse or other gynecologic concerns.    Gynecologic History No LMP recorded. (Menstrual status: IUD). Contraception: IUD Last Pap: 2019. Results: normal Last mammogram: n/a Gardisil: has received  Obstetric History OB History  Gravida Para Term Preterm AB Living  1 1       1   SAB TAB Ectopic Multiple Live Births          1    # Outcome Date GA Lbr Len/2nd Weight Sex Delivery Anes PTL Lv  1 Para 06/30/18    F Vag-Spont       Past Medical History:  Diagnosis Date  . Abnormal Pap smear of cervix   . Anxiety state 06/16/2015  . Depression 06/16/2015  . GERD (gastroesophageal reflux disease) 06/13/2015  . Lactose intolerance 06/16/2015    Past Surgical History:  Procedure Laterality Date  . COCCYX REMOVAL    . TONSILLECTOMY  06/16/2015  . WISDOM TOOTH EXTRACTION      Current Outpatient Medications on File Prior to Visit  Medication Sig Dispense Refill  . amphetamine-dextroamphetamine (ADDERALL XR) 20 MG 24 hr capsule Take 1 capsule (20 mg total) by mouth 2 (two) times daily. (Patient taking differently: Take 20 mg by mouth daily. ) 60 capsule 0  . amphetamine-dextroamphetamine (ADDERALL) 10 MG tablet Take 10 mg by mouth daily in the afternoon. In the afternoon    . escitalopram (LEXAPRO) 10 MG tablet Take 10 mg by mouth daily.    . famotidine (PEPCID) 40 MG tablet Take 1 tablet (40 mg total) by mouth every evening. 30 tablet 11  . levonorgestrel (MIRENA) 20 MCG/24HR IUD by Intrauterine route.    . Multiple Vitamins-Minerals (MULTIVITAMIN PO) Take by mouth daily.    . pantoprazole (PROTONIX) 40 MG tablet Take 1 tablet (40 mg  total) by mouth daily. 30 tablet 3   No current facility-administered medications on file prior to visit.    No Known Allergies  Social History   Socioeconomic History  . Marital status: Single    Spouse name: Not on file  . Number of children: 0  . Years of education: BSN  . Highest education level: Not on file  Occupational History  . Occupation: Management consultant    Comment: [employer]  Tobacco Use  . Smoking status: Never Smoker  . Smokeless tobacco: Never Used  Vaping Use  . Vaping Use: Never used  Substance and Sexual Activity  . Alcohol use: Not Currently  . Drug use: Never  . Sexual activity: Not Currently    Partners: Male    Birth control/protection: I.U.D.  Other Topics Concern  . Not on file  Social History Narrative   Lives with a roommate, also a Marine scientist.   Mother lives in [place], [place].   Father lives in [place], [place].   Smoke detectors in home? Yes    Guns in home? No    Consistent seatbelt use? Yes    Consistent dental brushing and flossing? Yes    Semi-Annual dental visits: Yes    Annual Eye exams: last visit 01/2017   Social Determinants of Health   Financial Resource Strain:   . Difficulty of Paying Living Expenses:  Food Insecurity:   . Worried About Charity fundraiser in the Last Year:   . Arboriculturist in the Last Year:   Transportation Needs:   . Film/video editor (Medical):   Marland Kitchen Lack of Transportation (Non-Medical):   Physical Activity:   . Days of Exercise per Week:   . Minutes of Exercise per Session:   Stress:   . Feeling of Stress :   Social Connections:   . Frequency of Communication with Friends and Family:   . Frequency of Social Gatherings with Friends and Family:   . Attends Religious Services:   . Active Member of Clubs or Organizations:   . Attends Archivist Meetings:   Marland Kitchen Marital Status:   Intimate Partner Violence:   . Fear of Current or Ex-Partner:   . Emotionally Abused:   Marland Kitchen Physically Abused:   .  Sexually Abused:     Family History  Problem Relation Age of Onset  . Depression Father   . Depression Sister   . Hypertension Maternal Grandfather   . Hypertension Paternal Grandfather   . Depression Cousin   . Colon cancer Neg Hx   . Esophageal cancer Neg Hx   . Rectal cancer Neg Hx   . Stomach cancer Neg Hx     The following portions of the patient's history were reviewed and updated as appropriate: allergies, current medications, past family history, past medical history, past social history, past surgical history and problem list.  Review of Systems Pertinent items are noted in HPI.   Objective:  BP 106/67   Pulse 96   Resp 16   Ht 5\' 6"  (1.676 m)   Wt 147 lb (66.7 kg)   BMI 23.73 kg/m  CONSTITUTIONAL: Well-developed, well-nourished female in no acute distress.  HENT:  Normocephalic, atraumatic, External right and left ear normal. Oropharynx is clear and moist EYES: Conjunctivae and EOM are normal. Pupils are equal, round, and reactive to light. No scleral icterus.  NECK: Normal range of motion, supple, no masses.  Normal thyroid.  SKIN: Skin is warm and dry. No rash noted. Not diaphoretic. No erythema. No pallor. NEUROLOGIC: Alert and oriented to person, place, and time. Normal reflexes, muscle tone coordination. No cranial nerve deficit noted. PSYCHIATRIC: Normal mood and affect. Normal behavior. Normal judgment and thought content. CARDIOVASCULAR: Normal heart rate noted RESPIRATORY: Effort normal, no problems with respiration noted. BREASTS: Symmetric in size. No masses, skin changes, nipple drainage, or lymphadenopathy. ABDOMEN: Soft, no distention noted.  No tenderness, rebound or guarding.  PELVIC: Normal appearing external genitalia; normal appearing vaginal mucosa and cervix.  No abnormal discharge noted.  Pap smear obtained. Pelvic cultures obtained. Blue IUd strings protruding from cervix. Normal uterine size, no other palpable masses, no uterine or adnexal  tenderness. MUSCULOSKELETAL: Normal range of motion. No tenderness.  No cyanosis, clubbing, or edema.  2+ distal pulses.  Exam done with chaperone present.  Assessment and Plan:   1. Well woman exam No issues  2. Routine screening for STI (sexually transmitted infection) GC/CT/Trich on pap - Hepatitis B surface antigen - Hepatitis C antibody - HIV Antibody (routine testing w rflx) - RPR  3. Cervical cancer screening Pap today - Cytology - PAP( [facility])  4. Rectal bleeding Refer to GI, no visible hemorrhoid  5. IUD (intrauterine device) in place Happy with IUD Due for change 2024 Strings visible  Will follow up results of pap smear/STI screen and manage accordingly. Encouraged improvement in diet  and exercise.  Mammogram n/a  Routine preventative health maintenance measures emphasized. Please refer to After Visit Summary for other counseling recommendations.   Total face-to-face time with patient: 20 minutes. Over 50% of encounter was spent on counseling and coordination of care.   Feliz Beam, M.D. Attending Center for [employer] Fish farm manager)

## 2020-03-31 LAB — HIV ANTIBODY (ROUTINE TESTING W REFLEX): HIV 1&2 Ab, 4th Generation: NONREACTIVE

## 2020-03-31 LAB — HEPATITIS C ANTIBODY
Hepatitis C Ab: NONREACTIVE
SIGNAL TO CUT-OFF: 0.01 (ref <1.00)

## 2020-03-31 LAB — RPR: RPR Ser Ql: NONREACTIVE

## 2020-03-31 LAB — HEPATITIS B SURFACE ANTIGEN: Hepatitis B Surface Ag: NONREACTIVE

## 2020-04-06 ENCOUNTER — Other Ambulatory Visit (HOSPITAL_COMMUNITY): Payer: Self-pay | Admitting: Physician Assistant

## 2020-04-06 LAB — CYTOLOGY - PAP
Chlamydia: NEGATIVE
Comment: NEGATIVE
Comment: NEGATIVE
Comment: NEGATIVE
Comment: NEGATIVE
Comment: NORMAL
HPV 16: NEGATIVE
HPV 18 / 45: NEGATIVE
High risk HPV: POSITIVE — AB
Neisseria Gonorrhea: NEGATIVE
Trichomonas: NEGATIVE

## 2020-04-06 MED FILL — ADDERALL XR 20 MG CAP SA: 20 | 30 days supply | Qty: 60 | Fill #0

## 2020-04-06 MED FILL — CITALOPRAM HBR 20 MG TABLET: 20 | 30 days supply | Qty: 30 | Fill #0

## 2020-04-18 ENCOUNTER — Encounter: Payer: Self-pay | Admitting: Gastroenterology

## 2020-04-18 ENCOUNTER — Ambulatory Visit (AMBULATORY_SURGERY_CENTER): Payer: Self-pay | Admitting: *Deleted

## 2020-04-18 ENCOUNTER — Other Ambulatory Visit: Payer: Self-pay

## 2020-04-18 VITALS — Ht 66.0 in | Wt 147.2 lb

## 2020-04-18 DIAGNOSIS — K625 Hemorrhage of anus and rectum: Secondary | ICD-10-CM

## 2020-04-18 MED ORDER — SUPREP BOWEL PREP KIT 17.5-3.13-1.6 GM/177ML PO SOLN: 1.0000 | Freq: Once | ORAL | 0 refills | Status: AC

## 2020-04-18 MED FILL — SUPREP BOWEL PREP KIT: 17.5-3.13-1 | 2 days supply | Qty: 354 | Fill #0

## 2020-04-18 NOTE — Progress Notes (Signed)
Pt completed covid vaccines 10-04-19  Pt is aware that care partner will wait in the car during procedure; if they feel like they will be too hot or cold to wait in the car; they may wait in the 4 th floor lobby. Patient is aware to bring only one care partner. We want them to wear a mask (we do not have any that we can provide them), practice social distancing, and we will check their temperatures when they get here.  I did remind the patient that their care partner needs to stay in the parking lot the entire time and have a cell phone available, we will call them when the pt is ready for discharge. Patient will wear mask into building.   No trouble with anesthesia, difficulty with intubation or hx/fam hx of malignant hyperthermia per pt   No egg or soy allergy  No home oxygen use   No medications for weight loss taken  emmi information given  Pt denies constipation issues

## 2020-04-21 ENCOUNTER — Encounter: Payer: Self-pay | Admitting: Medical-Surgical

## 2020-04-21 DIAGNOSIS — R239 Unspecified skin changes: Secondary | ICD-10-CM

## 2020-04-25 ENCOUNTER — Ambulatory Visit (INDEPENDENT_AMBULATORY_CARE_PROVIDER_SITE_OTHER): Payer: No Typology Code available for payment source | Admitting: *Deleted

## 2020-04-25 ENCOUNTER — Other Ambulatory Visit: Payer: Self-pay

## 2020-04-25 VITALS — Wt 147.0 lb

## 2020-04-25 DIAGNOSIS — L700 Acne vulgaris: Secondary | ICD-10-CM | POA: Diagnosis not present

## 2020-04-25 LAB — POCT URINE PREGNANCY: Preg Test, Ur: NEGATIVE

## 2020-04-25 NOTE — Progress Notes (Signed)
NTS visit to sign up for isotretinoin: patient registered in Kelly [facility]nes

## 2020-04-26 ENCOUNTER — Encounter: Payer: Self-pay | Admitting: Gastroenterology

## 2020-04-26 ENCOUNTER — Ambulatory Visit (AMBULATORY_SURGERY_CENTER): Payer: No Typology Code available for payment source | Admitting: Gastroenterology

## 2020-04-26 VITALS — BP 103/66 | HR 65 | Temp 98.6°F | Resp 13 | Ht 66.0 in | Wt 147.0 lb

## 2020-04-26 DIAGNOSIS — K640 First degree hemorrhoids: Secondary | ICD-10-CM

## 2020-04-26 DIAGNOSIS — K921 Melena: Secondary | ICD-10-CM | POA: Diagnosis not present

## 2020-04-26 DIAGNOSIS — D125 Benign neoplasm of sigmoid colon: Secondary | ICD-10-CM

## 2020-04-26 MED ORDER — SODIUM CHLORIDE 0.9 % IV SOLN: 500.0000 mL | Freq: Once | INTRAVENOUS | Status: DC

## 2020-04-26 NOTE — Patient Instructions (Addendum)
Handouts given:  Hemorrhoid, Polyps, High Fiber Diet Start high fiber diet Continue present medications benefiber QD  Colace QD Preparation H supp ( OTC ) 1 in the rectum twice daily if needed Await pathology results Please make GI offivc visit in 6 weeks    YOU HAD AN ENDOSCOPIC PROCEDURE TODAY AT [facility]:   Refer to the procedure report that was given to you for any specific questions about what was found during the examination.  If the procedure report does not answer your questions, please call your gastroenterologist to clarify.  If you requested that your care partner not be given the details of your procedure findings, then the procedure report has been included in a sealed envelope for you to review at your convenience later.  YOU SHOULD EXPECT: Some feelings of bloating in the abdomen. Passage of more gas than usual.  Walking can help get rid of the air that was put into your GI tract during the procedure and reduce the bloating. If you had a lower endoscopy (such as a colonoscopy or flexible sigmoidoscopy) you may notice spotting of blood in your stool or on the toilet paper. If you underwent a bowel prep for your procedure, you may not have a normal bowel movement for a few days.  Please Note:  You might notice some irritation and congestion in your nose or some drainage.  This is from the oxygen used during your procedure.  There is no need for concern and it should clear up in a day or so.  SYMPTOMS TO REPORT IMMEDIATELY:   Following lower endoscopy (colonoscopy or flexible sigmoidoscopy):  Excessive amounts of blood in the stool  Significant tenderness or worsening of abdominal pains  Swelling of the abdomen that is new, acute  Fever of 100F or higher  For urgent or emergent issues, a gastroenterologist can be reached at any hour by calling [phone]. Do not use MyChart messaging for urgent concerns.    DIET:  We do recommend a small meal at  first, but then you may proceed to your regular diet.  Drink plenty of fluids but you should avoid alcoholic beverages for 24 hours.  ACTIVITY:  You should plan to take it easy for the rest of today and you should NOT DRIVE or use heavy machinery until tomorrow (because of the sedation medicines used during the test).    FOLLOW UP: Our staff will call the number listed on your records 48-72 hours following your procedure to check on you and address any questions or concerns that you may have regarding the information given to you following your procedure. If we do not reach you, we will leave a message.  We will attempt to reach you two times.  During this call, we will ask if you have developed any symptoms of COVID 19. If you develop any symptoms (ie: fever, flu-like symptoms, shortness of breath, cough etc.) before then, please call [phone].  If you test positive for Covid 19 in the 2 weeks post procedure, please call and report this information to [place].    If any biopsies were taken you will be contacted by phone or by letter within the next 1-3 weeks.  Please call us at [phone] if you have not heard about the biopsies in 3 weeks.    SIGNATURES/CONFIDENTIALITY: You and/or your care partner have signed paperwork which will be entered into your electronic medical record.  These signatures attest to the fact that that the  information above on your After Visit Summary has been reviewed and is understood.  Full responsibility of the confidentiality of this discharge information lies with you and/or your care-partner.YOU HAD AN ENDOSCOPIC PROCEDURE TODAY AT [facility]:   Refer to the procedure report that was given to you for any specific questions about what was found during the examination.  If the procedure report does not answer your questions, please call your gastroenterologist to clarify.  If you requested that your care partner not be given the details of your procedure  findings, then the procedure report has been included in a sealed envelope for you to review at your convenience later.  YOU SHOULD EXPECT: Some feelings of bloating in the abdomen. Passage of more gas than usual.  Walking can help get rid of the air that was put into your GI tract during the procedure and reduce the bloating. If you had a lower endoscopy (such as a colonoscopy or flexible sigmoidoscopy) you may notice spotting of blood in your stool or on the toilet paper. If you underwent a bowel prep for your procedure, you may not have a normal bowel movement for a few days.  Please Note:  You might notice some irritation and congestion in your nose or some drainage.  This is from the oxygen used during your procedure.  There is no need for concern and it should clear up in a day or so.  SYMPTOMS TO REPORT IMMEDIATELY:   Following lower endoscopy (colonoscopy or flexible sigmoidoscopy):  Excessive amounts of blood in the stool  Significant tenderness or worsening of abdominal pains  Swelling of the abdomen that is new, acute  Fever of 100F or higher  For urgent or emergent issues, a gastroenterologist can be reached at any hour by calling [phone]. Do not use MyChart messaging for urgent concerns.    DIET:  We do recommend a small meal at first, but then you may proceed to your regular diet.  Drink plenty of fluids but you should avoid alcoholic beverages for 24 hours.  ACTIVITY:  You should plan to take it easy for the rest of today and you should NOT DRIVE or use heavy machinery until tomorrow (because of the sedation medicines used during the test).    FOLLOW UP: Our staff will call the number listed on your records 48-72 hours following your procedure to check on you and address any questions or concerns that you may have regarding the information given to you following your procedure. If we do not reach you, we will leave a message.  We will attempt to reach you two times.   During this call, we will ask if you have developed any symptoms of COVID 19. If you develop any symptoms (ie: fever, flu-like symptoms, shortness of breath, cough etc.) before then, please call [phone].  If you test positive for Covid 19 in the 2 weeks post procedure, please call and report this information to [place].    If any biopsies were taken you will be contacted by phone or by letter within the next 1-3 weeks.  Please call us at [phone] if you have not heard about the biopsies in 3 weeks.    SIGNATURES/CONFIDENTIALITY: You and/or your care partner have signed paperwork which will be entered into your electronic medical record.  These signatures attest to the fact that that the information above on your After Visit Summary has been reviewed and is understood.  Full responsibility of the confidentiality  of this discharge information lies with you and/or your care-partner.

## 2020-04-26 NOTE — Progress Notes (Signed)
Called to room to assist during endoscopic procedure.  Patient ID and intended procedure confirmed with present staff. Received instructions for my participation in the procedure from the performing physician.  

## 2020-04-26 NOTE — Op Note (Signed)
[place] Patient Name: Kelly Haynes Procedure Date: 04/26/2020 3:40 PM MRN: [id number] Endoscopist: Ladene Artist , MD Age: 29 Referring MD:  Date of Birth: 1991-04-28 Gender: Female Account #: [id number] Procedure:                Colonoscopy Indications:              Hematochezia Medicines:                Monitored Anesthesia Care Procedure:                Pre-Anesthesia Assessment:                           - Prior to the procedure, a History and Physical                            was performed, and patient medications and                            allergies were reviewed. The patient's tolerance of                            previous anesthesia was also reviewed. The risks                            and benefits of the procedure and the sedation                            options and risks were discussed with the patient.                            All questions were answered, and informed consent                            was obtained. Prior Anticoagulants: The patient has                            taken no previous anticoagulant or antiplatelet                            agents. ASA Grade Assessment: II - A patient with                            mild systemic disease. After reviewing the risks                            and benefits, the patient was deemed in                            satisfactory condition to undergo the procedure.                           After obtaining informed consent, the colonoscope  was passed under direct vision. Throughout the                            procedure, the patient's blood pressure, pulse, and                            oxygen saturations were monitored continuously. The                            Colonoscope was introduced through the anus and                            advanced to the the cecum, identified by                            appendiceal orifice and ileocecal valve. The                             ileocecal valve, appendiceal orifice, and rectum                            were photographed. The quality of the bowel                            preparation was good. The colonoscopy was performed                            without difficulty. The patient tolerated the                            procedure well. Scope In: 3:52:20 PM Scope Out: 4:14:49 PM Scope Withdrawal Time: 0 hours 17 minutes 28 seconds  Total Procedure Duration: 0 hours 22 minutes 29 seconds  Findings:                 The perianal and digital rectal examinations were                            normal.                           Two sessile polyps were found in the sigmoid colon.                            The polyps were 6 to 7 mm in size. These polyps                            were removed with a cold snare. Resection and                            retrieval were complete.                           Internal hemorrhoids were found during  retroflexion. The hemorrhoids were small and Grade                            I (internal hemorrhoids that do not prolapse).                           The exam was otherwise without abnormality on                            direct and retroflexion views. Complications:            No immediate complications. Estimated blood loss:                            None. Estimated Blood Loss:     Estimated blood loss: none. Impression:               - Two 6 to 7 mm polyps in the sigmoid colon,                            removed with a cold snare. Resected and retrieved.                           - Internal hemorrhoids.                           - The examination was otherwise normal on direct                            and retroflexion views. Recommendation:           - Repeat colonoscopy date to be determined after                            pending pathology results are reviewed for                            surveillance based on pathology  results.                           - Patient has a contact number available for                            emergencies. The signs and symptoms of potential                            delayed complications were discussed with the                            patient. Return to normal activities tomorrow.                            Written discharge instructions were provided to the                            patient.                           -  High fiber diet with adequate daily fluid intake.                           - Continue present medications.                           - Benefiber QD.                           - Colace QD.                           - Preparation H supp (OTC) 1 PR BID prn hemorrhoid                            symptoms.                           - Await pathology results.                           - GI office visit in 6 weeks. Ladene Artist, MD 04/26/2020 4:20:16 PM This report has been signed electronically.

## 2020-04-26 NOTE — Progress Notes (Signed)
Cw vitals and HC IV. 

## 2020-04-27 MED FILL — PANTOPRAZOLE SOD DR 40 MG T: 40 | 30 days supply | Qty: 30 | Fill #2

## 2020-04-27 MED FILL — FAMOTIDINE 40 MG TABLET: 40 | 30 days supply | Qty: 30 | Fill #2

## 2020-04-28 ENCOUNTER — Telehealth: Payer: Self-pay | Admitting: *Deleted

## 2020-04-28 NOTE — Telephone Encounter (Signed)
°  Follow up Call-  Call back number 04/26/2020 01/31/2020  Post procedure Call Back phone  # [phone]  Permission to leave phone message Yes Yes  Some recent data might be hidden   No answer, mailbox full

## 2020-04-28 NOTE — Telephone Encounter (Signed)
First follow up call made, mailbox full.

## 2020-05-04 ENCOUNTER — Other Ambulatory Visit: Payer: Self-pay

## 2020-05-04 ENCOUNTER — Encounter: Payer: Self-pay | Admitting: Obstetrics and Gynecology

## 2020-05-04 ENCOUNTER — Other Ambulatory Visit: Payer: Self-pay | Admitting: Obstetrics and Gynecology

## 2020-05-04 ENCOUNTER — Ambulatory Visit (INDEPENDENT_AMBULATORY_CARE_PROVIDER_SITE_OTHER): Payer: No Typology Code available for payment source | Admitting: Obstetrics and Gynecology

## 2020-05-04 VITALS — BP 110/69 | HR 97 | Resp 16 | Ht 66.0 in | Wt 145.0 lb

## 2020-05-04 DIAGNOSIS — R87810 Cervical high risk human papillomavirus (HPV) DNA test positive: Secondary | ICD-10-CM

## 2020-05-04 DIAGNOSIS — R87612 Low grade squamous intraepithelial lesion on cytologic smear of cervix (LGSIL): Secondary | ICD-10-CM | POA: Diagnosis not present

## 2020-05-04 DIAGNOSIS — Z01812 Encounter for preprocedural laboratory examination: Secondary | ICD-10-CM

## 2020-05-04 DIAGNOSIS — R87619 Unspecified abnormal cytological findings in specimens from cervix uteri: Secondary | ICD-10-CM

## 2020-05-04 LAB — POCT URINE PREGNANCY: Preg Test, Ur: NEGATIVE

## 2020-05-04 NOTE — Progress Notes (Signed)
Colposcopy Procedure Note  Kelly Haynes is a 29 y.o. G1P1 here for colposcopy.  Indications:  LGSIL, positive high risk HPV, neg 16/18/45  Procedure Details  LMP : na/ IUD in place; UPT neg.    The risks (including infection, bleeding, pain) and benefits of the procedure were explained to the patient and written informed consent was obtained.  The patient was placed in the dorsal lithotomy position. A Graves was speculum inserted in the vagina, and the cervix was visualized.  The cervix was stained with acetic acid and visualized using the colposcope under magnification as well as with a green filter. Findings as below. Cervical biopsies were taken at 12, 3, 6, 9 o'clock. Endocervical curettage then performed in all four quadrants. Small amount of bleeding noted that improved with pressure. Patient tolerating procedure well.  Findings: increased vascularity at 3 o'clock, glandular tissue at 9 o'clock, acetowhite at 12 and 6 o'clock  Impression: low grade   Adequate: yes  Specimens:  1. Cervical biopsy at 12, 3, 6, 9 o'clock 2. Endocervical curettage  Condition: Stable  Complications: None  Plan: The patient was advised to call for any fever or for prolonged or severe pain or bleeding. She was advised to use OTC analgesics as needed for mild to moderate pain. She was advised to avoid vaginal intercourse for 48 hours or until the bleeding has completely stopped.  Will base further management on results of biopsy.   Feliz Beam, M.D. Attending Center for [employer] Fish farm manager)

## 2020-05-10 ENCOUNTER — Telehealth: Payer: Self-pay | Admitting: *Deleted

## 2020-05-10 NOTE — Telephone Encounter (Signed)
-----   Message from Sloan Leiter, MD sent at 05/10/2020  2:31 PM EDT ----- CIN 1-2 on path, please set up appointment to discuss results

## 2020-05-10 NOTE — Telephone Encounter (Signed)
Not able to leave patient a message to make a F/U appointment with Dr. Rosana Hoes to discuss colpo results.

## 2020-05-11 ENCOUNTER — Encounter: Payer: Self-pay | Admitting: Gastroenterology

## 2020-05-16 MED FILL — ADDERALL XR 20 MG CAP SA: 20 | 30 days supply | Qty: 60 | Fill #0

## 2020-06-01 ENCOUNTER — Telehealth (INDEPENDENT_AMBULATORY_CARE_PROVIDER_SITE_OTHER): Payer: No Typology Code available for payment source | Admitting: Obstetrics and Gynecology

## 2020-06-01 ENCOUNTER — Other Ambulatory Visit: Payer: Self-pay

## 2020-06-01 ENCOUNTER — Ambulatory Visit (INDEPENDENT_AMBULATORY_CARE_PROVIDER_SITE_OTHER): Payer: No Typology Code available for payment source | Admitting: Physician Assistant

## 2020-06-01 ENCOUNTER — Encounter: Payer: Self-pay | Admitting: Physician Assistant

## 2020-06-01 ENCOUNTER — Encounter: Payer: Self-pay | Admitting: Obstetrics and Gynecology

## 2020-06-01 VITALS — Wt 147.0 lb

## 2020-06-01 DIAGNOSIS — L700 Acne vulgaris: Secondary | ICD-10-CM

## 2020-06-01 DIAGNOSIS — N871 Moderate cervical dysplasia: Secondary | ICD-10-CM | POA: Diagnosis not present

## 2020-06-01 MED ORDER — ISOTRETINOIN 30 MG PO CAPS: 30.0000 mg | ORAL_CAPSULE | Freq: Every day | ORAL | 0 refills | Status: DC

## 2020-06-01 NOTE — Progress Notes (Signed)
    GYNECOLOGY VIRTUAL VISIT ENCOUNTER NOTE  Provider location: [facility] for [facility] [place]   I connected with Kelly Haynes on 06/01/20 at  2:15 PM EDT by MyChart Video Encounter at home and verified that I am speaking with the correct person using two identifiers.   I discussed the limitations, risks, security and privacy concerns of performing an evaluation and management service virtually and the availability of in person appointments. I also discussed with the patient that there may be a patient responsible charge related to this service. The patient expressed understanding and agreed to proceed.   History:  Kelly Haynes is a 29 y.o. G1P1 female here for follow up for CIN 1-2 on colposcopy. She denies any abnormal vaginal discharge, bleeding, pelvic pain or other concerns.       Past Medical History:  Diagnosis Date  . Abnormal Pap smear of cervix   . Anxiety state 06/16/2015  . Depression 06/16/2015  . GERD (gastroesophageal reflux disease) 06/13/2015  . Lactose intolerance 06/16/2015   Past Surgical History:  Procedure Laterality Date  . COCCYX REMOVAL    . TONSILLECTOMY  06/16/2015  . WISDOM TOOTH EXTRACTION     The following portions of the patient's history were reviewed and updated as appropriate: allergies, current medications, past family history, past medical history, past social history, past surgical history and problem list.   Review of Systems:  Pertinent items noted in HPI and remainder of comprehensive ROS otherwise negative.  Physical Exam:   General:  Alert, oriented and cooperative. Patient appears to be in no acute distress.  Mental Status: Normal mood and affect. Normal behavior. Normal judgment and thought content.   Respiratory: Normal respiratory effort, no problems with respiration noted  Rest of physical exam deferred due to type of encounter  Labs and Imaging No results found for this or any previous visit (from the past 336  hour(s)). No results found.     Assessment and Plan:   1. CIN II (cervical intraepithelial neoplasia II) Reviewed pathology, CIN 1-2, course, etiology. Reviewed recommendation for treatment given risk of progression. Reviewed options for ablative therapy/cryotheraphy versus excision with LEEP, reviewed risks/benefits of each including risk of pre-term birth with LEEP. Answered all questions, patient prefers to proceed with LEEP procedure.  - will scheduled for LEEP - call with any questions    I discussed the assessment and treatment plan with the patient. The patient was provided an opportunity to ask questions and all were answered. The patient agreed with the plan and demonstrated an understanding of the instructions.   The patient was advised to call back or seek an in-person evaluation/go to the ED if the symptoms worsen or if the condition fails to improve as anticipated.  I provided 15 minutes of face-to-face time during this encounter.   Sloan Leiter, MD Center for [facility], [place]

## 2020-06-01 NOTE — Progress Notes (Signed)
   Isotretinoin Follow-Up Visit   Subjective  Kelly Haynes is a 29 y.o. female who presents for the following: Follow-up and Acne. She is here to start isotretinoin. She has done this before in 2013. She has been using the [facility] foam since last visit.    The following portions of the chart were reviewed this encounter and updated as appropriate: Tobacco  Allergies  Meds  Problems  Med Hx  Surg Hx  Fam Hx        Review of Systems: No other skin or systemic complaints.  Objective  Well appearing patient in no apparent distress; mood and affect are within normal limits.  Face examined. Relevant physical exam findings are noted in the Assessment and Plan.  Objective  Head - Anterior (Face), Left Hip (side) - Posterior, Mid Back, Right Hip (side) - Posterior: Erythematous papules and pustules with comedones   Assessment & Plan  Acne vulgaris (4) Head - Anterior (Face); Left Hip (side) - Posterior; Right Hip (side) - Posterior; Mid Back  ISOtretinoin (ACCUTANE) 30 MG capsule - Head - Anterior (Face)  CBC with Differential/Platelet - Head - Anterior (Face)  Comprehensive metabolic panel - Head - Anterior (Face)  Lipid panel - Head - Anterior (Face)  hCG, serum, qualitative - Head - Anterior (Face)

## 2020-06-02 LAB — COMPREHENSIVE METABOLIC PANEL
AG Ratio: 1.7 (calc) (ref 1.0–2.5)
ALT: 16 U/L (ref 6–29)
AST: 16 U/L (ref 10–30)
Albumin: 4.3 g/dL (ref 3.6–5.1)
Alkaline phosphatase (APISO): 69 U/L (ref 31–125)
BUN: 12 mg/dL (ref 7–25)
CO2: 25 mmol/L (ref 20–32)
Calcium: 10 mg/dL (ref 8.6–10.2)
Chloride: 106 mmol/L (ref 98–110)
Creat: 0.85 mg/dL (ref 0.50–1.10)
Globulin: 2.6 g/dL (calc) (ref 1.9–3.7)
Glucose, Bld: 90 mg/dL (ref 65–99)
Potassium: 3.8 mmol/L (ref 3.5–5.3)
Sodium: 139 mmol/L (ref 135–146)
Total Bilirubin: 0.7 mg/dL (ref 0.2–1.2)
Total Protein: 6.9 g/dL (ref 6.1–8.1)

## 2020-06-02 LAB — LIPID PANEL
Cholesterol: 171 mg/dL (ref <200)
HDL: 54 mg/dL (ref >=50)
LDL Cholesterol (Calc): 94 mg/dL (calc)
Non-HDL Cholesterol (Calc): 117 mg/dL (calc) (ref <130)
Total CHOL/HDL Ratio: 3.2 (calc) (ref <5.0)
Triglycerides: 134 mg/dL (ref <150)

## 2020-06-02 LAB — CBC WITH DIFFERENTIAL/PLATELET
Absolute Monocytes: 490 cells/uL (ref 200–950)
Basophils Absolute: 48 cells/uL (ref 0–200)
Basophils Relative: 0.7 %
Eosinophils Absolute: 48 cells/uL (ref 15–500)
Eosinophils Relative: 0.7 %
HCT: 40.6 % (ref 35.0–45.0)
Hemoglobin: 13.9 g/dL (ref 11.7–15.5)
Lymphs Abs: 2591 cells/uL (ref 850–3900)
MCH: 31.4 pg (ref 27.0–33.0)
MCHC: 34.2 g/dL (ref 32.0–36.0)
MCV: 91.9 fL (ref 80.0–100.0)
MPV: 10.8 fL (ref 7.5–12.5)
Monocytes Relative: 7.2 %
Neutro Abs: 3624 cells/uL (ref 1500–7800)
Neutrophils Relative %: 53.3 %
Platelets: 239 10*3/uL (ref 140–400)
RBC: 4.42 10*6/uL (ref 3.80–5.10)
RDW: 11.9 % (ref 11.0–15.0)
Total Lymphocyte: 38.1 %
WBC: 6.8 10*3/uL (ref 3.8–10.8)

## 2020-06-02 LAB — HCG, SERUM, QUALITATIVE: Preg, Serum: NEGATIVE

## 2020-06-02 MED FILL — MYORISAN 30 MG CAPSULE: 30 | 30 days supply | Qty: 30 | Fill #0

## 2020-06-05 MED FILL — FAMOTIDINE 40 MG TABLET: 40 | 30 days supply | Qty: 30 | Fill #3

## 2020-06-20 ENCOUNTER — Ambulatory Visit (INDEPENDENT_AMBULATORY_CARE_PROVIDER_SITE_OTHER): Payer: No Typology Code available for payment source | Admitting: Gastroenterology

## 2020-06-20 ENCOUNTER — Encounter: Payer: Self-pay | Admitting: Gastroenterology

## 2020-06-20 VITALS — BP 102/80 | HR 104 | Ht 66.0 in | Wt 143.4 lb

## 2020-06-20 DIAGNOSIS — K219 Gastro-esophageal reflux disease without esophagitis: Secondary | ICD-10-CM | POA: Diagnosis not present

## 2020-06-20 DIAGNOSIS — K648 Other hemorrhoids: Secondary | ICD-10-CM

## 2020-06-20 DIAGNOSIS — Z86010 Personal history of colonic polyps: Secondary | ICD-10-CM | POA: Diagnosis not present

## 2020-06-20 NOTE — Progress Notes (Signed)
    History of Present Illness: This is a 29 year old female who relates persistent problems with intermittent rectal bleeding.  She has modified her diet and does not have hard stools or straining however she still notes small amounts of bright red blood per rectum sometimes dripping into the commode following bowel movements.  She has not tried Preparation H suppositories as previously recommended.  Reflux has been under very good control so she discontinued pantoprazole and has maintained famotidine.  - Two 6 to 7 mm polyps in the sigmoid colon, removed with a cold snare. Resected and retrieved. - Internal hemorrhoids. - The examination was otherwise normal exam.   Current Medications, Allergies, Past Medical History, Past Surgical History, Family History and Social History were reviewed in [employer] record.   Physical Exam: General: Well developed, well nourished, no acute distress Head: Normocephalic and atraumatic Eyes:  sclerae anicteric, EOMI Ears: Normal auditory acuity Mouth: Not examined, mask on during Covid-19 pandemic Lungs: Clear throughout to auscultation Heart: Regular rate and rhythm; no murmurs, rubs or bruits Abdomen: Soft, non tender and non distended. No masses, hepatosplenomegaly or hernias noted. Normal Bowel sounds Rectal: Not done Musculoskeletal: Symmetrical with no gross deformities  Pulses:  Normal pulses noted Extremities: No clubbing, cyanosis, edema or deformities noted Neurological: Alert oriented x 4, grossly nonfocal Psychological:  Alert and cooperative. Normal mood and affect   Assessment and Recommendations:  1. Internal hemorrhoids with intermittent bleeding.  Advised to continue a high fiber diet with adequate daily fluid intake.  When rectal bleeding or other hemorrhoidal symptoms occurs she is advised to use Preparation H suppositories PR at bedtime for 5-7 nights in a row.  She can repeat this sequence a few times over  the next couple months and if symptoms are not controlled we discussed proceeding with hemorrhoidal banding.  2. GERD. Erosive gastritis. Follow antireflux measures and continue famotidine 40 mg daily for the next month.  If her symptoms remain well controlled she can try discontinuing famotidine and following antireflux measures.  If symptoms are not controlled resume famotidine 40 mg daily.  3.  Personal history of adenomatous colon polyps.  I addressed her questions regarding colon polyps and surveillance colonoscopy to her satisfaction.  We reviewed the current guidelines at this 7-year interval surveillance colonoscopy which will be due in August 2028.

## 2020-06-20 NOTE — Patient Instructions (Signed)
Remain on famotidine.   You can purchase over the counter preporation H suppositories at bedtime x 5-7 days. You can repeat for 5-7 days if still symptomatic.   If your symptoms are not better, please call our office and schedule a hemorrhoid banding.   Thank you for choosing me and [facility].  Pricilla Riffle. Dagoberto Ligas., MD., [facility]

## 2020-06-22 ENCOUNTER — Encounter: Payer: Self-pay | Admitting: Obstetrics and Gynecology

## 2020-06-22 ENCOUNTER — Other Ambulatory Visit: Payer: Self-pay

## 2020-06-22 ENCOUNTER — Other Ambulatory Visit: Payer: Self-pay | Admitting: Obstetrics and Gynecology

## 2020-06-22 ENCOUNTER — Ambulatory Visit: Payer: No Typology Code available for payment source | Admitting: Obstetrics and Gynecology

## 2020-06-22 VITALS — BP 108/68 | HR 89 | Resp 16 | Ht 66.0 in | Wt 142.0 lb

## 2020-06-22 DIAGNOSIS — Z9889 Other specified postprocedural states: Secondary | ICD-10-CM | POA: Insufficient documentation

## 2020-06-22 DIAGNOSIS — Z01812 Encounter for preprocedural laboratory examination: Secondary | ICD-10-CM

## 2020-06-22 DIAGNOSIS — N871 Moderate cervical dysplasia: Secondary | ICD-10-CM

## 2020-06-22 LAB — POCT URINE PREGNANCY: Preg Test, Ur: NEGATIVE

## 2020-06-22 NOTE — Progress Notes (Signed)
   GYNECOLOGY OFFICE PROCEDURE NOTE  Kelly Haynes is a 29 y.o. G1P1 here for LEEP. No GYN concerns. Pap smear and colposcopy reviewed.  Medical history significant for n/a. IUD in place.  Pap: LSIOL Colpo Biopsy: CIN I-II ECC: negative  UPT: negative  The recommendation for LEEP was discussed with the patient. The risks, benefits, alternatives, and limitations of procedure were explained to patient, including pain, bleeding, infection, failure to remove abnormal tissue and failure to cure dysplasia, need for repeat procedures, damage to pelvic organs, cervical incompetence and risk of preterm delivery. Risk that final pathology may be improved or worsened from biopsy pathology. Role of HPV, cervical dysplasia and need for close followup was emphasized. Risk of cutting IUD strings or displacing IUD reviewed.  Informed written consent was obtained. All questions were answered. Time out performed.   ??Procedure: The patient was placed in lithotomy position and the bivalved coated speculum was placed in the patient's vagina. A grounding pad placed on the patient. Acetic acidLugol's solution was applied to the cervix and areas of decreased uptake were noted around the transformation zone.   Local anesthesia was administered via an intracervical block using 8 ml of 2% Lidocaine with epinephrine. The suction was turned on and the Small 1X Fisher Cone Biopsy Excisor on 40 Watts of blended current was used to excise the area of decreased uptake and excise the entire transformation zone. Minimal bleeding noted after excision of transformation zone. IUD strings cut in process. IUD hook and cytobrush used to tease IUD strings out, not able to. Hemostasis was achieved using roller ball coagulation set at 40 Watts coagulation current. Monsel's solution was then applied and the speculum was removed from the vagina. Specimens were sent to pathology.  ?The patient tolerated the procedure well. Post-operative  instructions given to patient, including instruction to seek medical attention for persistent bright red bleeding, fever, abdominal/pelvic pain, dysuria, nausea or vomiting. She was also told about the possibility of having copious yellow to black tinged discharge for weeks. She was counseled to avoid anything in the vagina (sex/douching/tampons) for 3 weeks. She has a 4 week post-operative check to assess wound healing, review results and discuss further management. Will need to look for IUD strings next visit.   Feliz Beam, M.D. Attending Center for [employer] Fish farm manager)

## 2020-06-27 ENCOUNTER — Other Ambulatory Visit (HOSPITAL_COMMUNITY): Payer: Self-pay | Admitting: Physician Assistant

## 2020-06-27 MED FILL — ADDERALL XR 20 MG CAP SA: 20 | 30 days supply | Qty: 60 | Fill #0

## 2020-06-27 MED FILL — CITALOPRAM HBR 20 MG TABLET: 20 | 30 days supply | Qty: 30 | Fill #0

## 2020-07-04 MED FILL — FAMOTIDINE 40 MG TABLET: 40 | 30 days supply | Qty: 30 | Fill #4

## 2020-07-06 ENCOUNTER — Ambulatory Visit (INDEPENDENT_AMBULATORY_CARE_PROVIDER_SITE_OTHER): Payer: No Typology Code available for payment source | Admitting: Dermatology

## 2020-07-06 ENCOUNTER — Other Ambulatory Visit: Payer: Self-pay | Admitting: Dermatology

## 2020-07-06 ENCOUNTER — Encounter: Payer: Self-pay | Admitting: Dermatology

## 2020-07-06 ENCOUNTER — Other Ambulatory Visit: Payer: Self-pay

## 2020-07-06 ENCOUNTER — Ambulatory Visit: Payer: No Typology Code available for payment source | Admitting: Physician Assistant

## 2020-07-06 VITALS — Wt 142.0 lb

## 2020-07-06 DIAGNOSIS — Z5181 Encounter for therapeutic drug level monitoring: Secondary | ICD-10-CM

## 2020-07-06 DIAGNOSIS — L700 Acne vulgaris: Secondary | ICD-10-CM | POA: Diagnosis not present

## 2020-07-06 MED ORDER — ISOTRETINOIN 30 MG PO CAPS: 30.0000 mg | ORAL_CAPSULE | Freq: Every day | ORAL | 0 refills | Status: DC

## 2020-07-07 LAB — CBC WITH DIFFERENTIAL/PLATELET
Absolute Monocytes: 508 cells/uL (ref 200–950)
Basophils Absolute: 54 cells/uL (ref 0–200)
Basophils Relative: 0.7 %
Eosinophils Absolute: 69 cells/uL (ref 15–500)
Eosinophils Relative: 0.9 %
HCT: 40.8 % (ref 35.0–45.0)
Hemoglobin: 13.9 g/dL (ref 11.7–15.5)
Lymphs Abs: 3172 cells/uL (ref 850–3900)
MCH: 31 pg (ref 27.0–33.0)
MCHC: 34.1 g/dL (ref 32.0–36.0)
MCV: 91.1 fL (ref 80.0–100.0)
MPV: 10.2 fL (ref 7.5–12.5)
Monocytes Relative: 6.6 %
Neutro Abs: 3896 cells/uL (ref 1500–7800)
Neutrophils Relative %: 50.6 %
Platelets: 252 10*3/uL (ref 140–400)
RBC: 4.48 10*6/uL (ref 3.80–5.10)
RDW: 11.8 % (ref 11.0–15.0)
Total Lymphocyte: 41.2 %
WBC: 7.7 10*3/uL (ref 3.8–10.8)

## 2020-07-07 LAB — HCG, SERUM, QUALITATIVE: Preg, Serum: NEGATIVE

## 2020-07-07 LAB — COMPREHENSIVE METABOLIC PANEL
AG Ratio: 1.8 (calc) (ref 1.0–2.5)
ALT: 15 U/L (ref 6–29)
AST: 16 U/L (ref 10–30)
Albumin: 4.4 g/dL (ref 3.6–5.1)
Alkaline phosphatase (APISO): 71 U/L (ref 31–125)
BUN: 13 mg/dL (ref 7–25)
CO2: 24 mmol/L (ref 20–32)
Calcium: 9.7 mg/dL (ref 8.6–10.2)
Chloride: 104 mmol/L (ref 98–110)
Creat: 0.81 mg/dL (ref 0.50–1.10)
Globulin: 2.4 g/dL (calc) (ref 1.9–3.7)
Glucose, Bld: 71 mg/dL (ref 65–99)
Potassium: 4.1 mmol/L (ref 3.5–5.3)
Sodium: 139 mmol/L (ref 135–146)
Total Bilirubin: 0.5 mg/dL (ref 0.2–1.2)
Total Protein: 6.8 g/dL (ref 6.1–8.1)

## 2020-07-07 LAB — LIPID PANEL
Cholesterol: 171 mg/dL (ref <200)
HDL: 52 mg/dL (ref >=50)
LDL Cholesterol (Calc): 92 mg/dL (calc)
Non-HDL Cholesterol (Calc): 119 mg/dL (calc) (ref <130)
Total CHOL/HDL Ratio: 3.3 (calc) (ref <5.0)
Triglycerides: 171 mg/dL — ABNORMAL HIGH (ref <150)

## 2020-07-12 MED FILL — MYORISAN 30 MG CAPSULE: 30 | 30 days supply | Qty: 30 | Fill #0

## 2020-07-18 ENCOUNTER — Other Ambulatory Visit (HOSPITAL_COMMUNITY): Payer: Self-pay | Admitting: Physician Assistant

## 2020-07-20 ENCOUNTER — Ambulatory Visit (INDEPENDENT_AMBULATORY_CARE_PROVIDER_SITE_OTHER): Payer: No Typology Code available for payment source | Admitting: Obstetrics and Gynecology

## 2020-07-20 ENCOUNTER — Other Ambulatory Visit: Payer: Self-pay

## 2020-07-20 ENCOUNTER — Encounter: Payer: Self-pay | Admitting: Obstetrics and Gynecology

## 2020-07-20 VITALS — BP 114/81 | HR 118 | Wt 142.0 lb

## 2020-07-20 DIAGNOSIS — Z30431 Encounter for routine checking of intrauterine contraceptive device: Secondary | ICD-10-CM

## 2020-07-20 DIAGNOSIS — Z9889 Other specified postprocedural states: Secondary | ICD-10-CM | POA: Diagnosis not present

## 2020-07-20 NOTE — Progress Notes (Signed)
GYNECOLOGY OFFICE FOLLOW UP NOTE  History:  29 y.o. G1P1 here today for follow up for LEEP. Had minimal pain after procedure. Having some brown discharge.    Past Medical History:  Diagnosis Date  . Abnormal Pap smear of cervix   . Anxiety state 06/16/2015  . Depression 06/16/2015  . GERD (gastroesophageal reflux disease) 06/13/2015  . Lactose intolerance 06/16/2015    Past Surgical History:  Procedure Laterality Date  . COCCYX REMOVAL    . TONSILLECTOMY  06/16/2015  . WISDOM TOOTH EXTRACTION       Current Outpatient Medications:  .  amphetamine-dextroamphetamine (ADDERALL XR) 20 MG 24 hr capsule, Take 1 capsule (20 mg total) by mouth 2 (two) times daily. (Patient taking differently: Take 20 mg by mouth daily. ), Disp: 60 capsule, Rfl: 0 .  citalopram (CELEXA) 20 MG tablet, Take 20 mg by mouth daily., Disp: , Rfl:  .  famotidine (PEPCID) 40 MG tablet, Take 1 tablet (40 mg total) by mouth every evening., Disp: 30 tablet, Rfl: 11 .  ISOtretinoin (ACCUTANE) 30 MG capsule, Take 1 capsule (30 mg total) by mouth daily., Disp: 30 capsule, Rfl: 0 .  levonorgestrel (MIRENA) 20 MCG/24HR IUD, by Intrauterine route., Disp: , Rfl:  .  Multiple Vitamins-Minerals (MULTIVITAMIN PO), Take by mouth daily., Disp: , Rfl:   The following portions of the patient's history were reviewed and updated as appropriate: allergies, current medications, past family history, past medical history, past social history, past surgical history and problem list.   Review of Systems:  Pertinent items noted in HPI and remainder of comprehensive ROS otherwise negative.   Objective:  Physical Exam BP 114/81   Pulse (!) 118   Wt 142 lb (64.4 kg)   BMI 22.92 kg/m  CONSTITUTIONAL: Well-developed, well-nourished female in no acute distress.  HENT:  Normocephalic, atraumatic. External right and left ear normal. Oropharynx is clear and moist EYES: Conjunctivae and EOM are normal. Pupils are equal, round, and reactive to  light. No scleral icterus.  NECK: Normal range of motion, supple, no masses SKIN: Skin is warm and dry. No rash noted. Not diaphoretic. No erythema. No pallor. NEUROLOGIC: Alert and oriented to person, place, and time. Normal reflexes, muscle tone coordination. No cranial nerve deficit noted. PSYCHIATRIC: Normal mood and affect. Normal behavior. Normal judgment and thought content. CARDIOVASCULAR: Normal heart rate noted RESPIRATORY: Effort normal, no problems with respiration noted ABDOMEN: Soft, no distention noted.   PELVIC: Normal appearing external genitalia; normal appearing vaginal mucosa and cervix.  Cervix appears to be healing well, No abnormal discharge noted.  IUd strings absent, not teased out with cytobrush MUSCULOSKELETAL: Normal range of motion. No edema noted.  Exam done with chaperone present.  Labs and Imaging Diagnosis Cervix, LEEP - HIGH GRADE SQUAMOUS INTRAEPITHELIAL LESION, CIN-II (MODERATE DYSPLASIA). Microscopic Comment High grade squamous dysplasia focally extends to each the ecto- and endocervical margins.  Assessment & Plan:   1. IUD check up IUD strings not seen Reviewed that it is in place, and not affected by cut strings, can attempt to remove and replace or leave in for now, likely strings will come down at some point, she is fine with leaving for now - reviewed may need hysteroscopy for removal, she is agreeable tot his plan  2. S/P LEEP Healing well Reviewed path, positive margins, CIN II and plans for pap and ECC in 6 months She verbalizes understanding of the above   Routine preventative health maintenance measures emphasized. Please refer to After  Visit Summary for other counseling recommendations.   Return in about 6 months (around 01/18/2021) for pap and ECC.  Total face-to-face time with patient: 22 minutes. Over 50% of encounter was spent on counseling and coordination of care.  Feliz Beam, M.D. Attending Center for [employer] Fish farm manager)

## 2020-07-21 MED FILL — CITALOPRAM HBR 20 MG TABLET: 20 | 30 days supply | Qty: 30 | Fill #0

## 2020-07-31 MED FILL — FAMOTIDINE 40 MG TABLET: 40 | 30 days supply | Qty: 30 | Fill #5

## 2020-08-07 MED FILL — ADDERALL XR 20 MG CAP SA: 20 | 30 days supply | Qty: 60 | Fill #0

## 2020-08-10 ENCOUNTER — Other Ambulatory Visit: Payer: Self-pay | Admitting: Dermatology

## 2020-08-10 ENCOUNTER — Encounter: Payer: Self-pay | Admitting: Dermatology

## 2020-08-10 ENCOUNTER — Other Ambulatory Visit: Payer: Self-pay

## 2020-08-10 ENCOUNTER — Ambulatory Visit: Payer: No Typology Code available for payment source | Admitting: Physician Assistant

## 2020-08-10 ENCOUNTER — Ambulatory Visit (INDEPENDENT_AMBULATORY_CARE_PROVIDER_SITE_OTHER): Payer: No Typology Code available for payment source | Admitting: Dermatology

## 2020-08-10 DIAGNOSIS — L700 Acne vulgaris: Secondary | ICD-10-CM

## 2020-08-10 DIAGNOSIS — Z5181 Encounter for therapeutic drug level monitoring: Secondary | ICD-10-CM

## 2020-08-10 MED ORDER — ISOTRETINOIN 30 MG PO CAPS: 30.0000 mg | ORAL_CAPSULE | Freq: Every day | ORAL | 0 refills | Status: DC

## 2020-08-10 MED ORDER — ISOTRETINOIN 40 MG PO CAPS: 40.0000 mg | ORAL_CAPSULE | Freq: Every day | ORAL | 0 refills | Status: DC

## 2020-08-11 LAB — PREGNANCY, URINE: Preg Test, Ur: NEGATIVE

## 2020-08-13 ENCOUNTER — Encounter: Payer: Self-pay | Admitting: Dermatology

## 2020-08-13 NOTE — Progress Notes (Signed)
° °  Follow-Up Visit   Subjective  Kelly Haynes is a 29 y.o. female who presents for the following: Acne (follow up 31 days isotret).  Acne Location: Predominantly facial Duration:  Quality: Improved but still active breakouts Associated Signs/Symptoms: Modifying Factors: Isotretinoin Severity:  Timing: Context:   Objective  Well appearing patient in no apparent distress; mood and affect are within normal limits.  A focused examination was performed including Head and neck.. Relevant physical exam findings are noted in the Assessment and Plan.   Assessment & Plan    Acne vulgaris Mid Forehead  Continue isotretinoin-follow up in 31 days.  Increase dose to 40 mg daily.  Maintain I pledge compliance.  Sun protection.  Other Related Procedures Pregnancy, urine CBC with Differential/Platelet Comprehensive metabolic panel Lipid panel hCG, serum, qualitative  Ordered Medications: ISOtretinoin (ACCUTANE) 40 MG capsule  Encounter for therapeutic drug monitoring  Other Related Procedures Pregnancy, urine CBC with Differential/Platelet Comprehensive metabolic panel Lipid panel hCG, serum, qualitative      I, Lavonna Monarch, MD, have reviewed all documentation for this visit.  The documentation on 08/13/20 for the exam, diagnosis, procedures, and orders are all accurate and complete.

## 2020-08-16 MED FILL — MYORISAN 40 MG CAPSULE: 40 | 30 days supply | Qty: 30 | Fill #0

## 2020-08-25 MED FILL — CITALOPRAM HBR 20 MG TABLET: 20 | 30 days supply | Qty: 30 | Fill #1

## 2020-09-05 MED FILL — FAMOTIDINE 40 MG TABLET: 40 | 30 days supply | Qty: 30 | Fill #6

## 2020-09-12 ENCOUNTER — Encounter: Payer: Self-pay | Admitting: Dermatology

## 2020-09-12 ENCOUNTER — Telehealth: Payer: Self-pay

## 2020-09-12 ENCOUNTER — Other Ambulatory Visit: Payer: Self-pay

## 2020-09-12 ENCOUNTER — Other Ambulatory Visit: Payer: Self-pay | Admitting: Dermatology

## 2020-09-12 ENCOUNTER — Ambulatory Visit (INDEPENDENT_AMBULATORY_CARE_PROVIDER_SITE_OTHER): Payer: No Typology Code available for payment source | Admitting: Dermatology

## 2020-09-12 DIAGNOSIS — Z5181 Encounter for therapeutic drug level monitoring: Secondary | ICD-10-CM

## 2020-09-12 DIAGNOSIS — L700 Acne vulgaris: Secondary | ICD-10-CM

## 2020-09-12 LAB — CBC WITH DIFFERENTIAL/PLATELET
Absolute Monocytes: 489 cells/uL (ref 200–950)
Basophils Absolute: 58 cells/uL (ref 0–200)
Basophils Relative: 0.8 %
Eosinophils Absolute: 80 cells/uL (ref 15–500)
Eosinophils Relative: 1.1 %
HCT: 41.2 % (ref 35.0–45.0)
Hemoglobin: 14.2 g/dL (ref 11.7–15.5)
Lymphs Abs: 3453 cells/uL (ref 850–3900)
MCH: 31.8 pg (ref 27.0–33.0)
MCHC: 34.5 g/dL (ref 32.0–36.0)
MCV: 92.4 fL (ref 80.0–100.0)
MPV: 10.7 fL (ref 7.5–12.5)
Monocytes Relative: 6.7 %
Neutro Abs: 3219 cells/uL (ref 1500–7800)
Neutrophils Relative %: 44.1 %
Platelets: 236 10*3/uL (ref 140–400)
RBC: 4.46 10*6/uL (ref 3.80–5.10)
RDW: 11.8 % (ref 11.0–15.0)
Total Lymphocyte: 47.3 %
WBC: 7.3 10*3/uL (ref 3.8–10.8)

## 2020-09-12 LAB — COMPREHENSIVE METABOLIC PANEL
AG Ratio: 1.8 (calc) (ref 1.0–2.5)
ALT: 15 U/L (ref 6–29)
AST: 18 U/L (ref 10–30)
Albumin: 4.6 g/dL (ref 3.6–5.1)
Alkaline phosphatase (APISO): 68 U/L (ref 31–125)
BUN: 9 mg/dL (ref 7–25)
CO2: 29 mmol/L (ref 20–32)
Calcium: 10 mg/dL (ref 8.6–10.2)
Chloride: 102 mmol/L (ref 98–110)
Creat: 0.77 mg/dL (ref 0.50–1.10)
Globulin: 2.5 g/dL (calc) (ref 1.9–3.7)
Glucose, Bld: 88 mg/dL (ref 65–99)
Potassium: 4.3 mmol/L (ref 3.5–5.3)
Sodium: 137 mmol/L (ref 135–146)
Total Bilirubin: 0.6 mg/dL (ref 0.2–1.2)
Total Protein: 7.1 g/dL (ref 6.1–8.1)

## 2020-09-12 LAB — LIPID PANEL
Cholesterol: 181 mg/dL (ref <200)
HDL: 54 mg/dL (ref >=50)
LDL Cholesterol (Calc): 98 mg/dL (calc)
Non-HDL Cholesterol (Calc): 127 mg/dL (calc) (ref <130)
Total CHOL/HDL Ratio: 3.4 (calc) (ref <5.0)
Triglycerides: 195 mg/dL — ABNORMAL HIGH (ref <150)

## 2020-09-12 LAB — HCG, SERUM, QUALITATIVE: Preg, Serum: NEGATIVE

## 2020-09-12 MED ORDER — ISOTRETINOIN 40 MG PO CAPS: 40.0000 mg | ORAL_CAPSULE | Freq: Every day | ORAL | 0 refills | Status: DC

## 2020-09-12 MED FILL — ADDERALL XR 20 MG CAP SA: 20 | 30 days supply | Qty: 60 | Fill #0

## 2020-09-12 NOTE — Telephone Encounter (Signed)
Phone call from patient stating that she's at [facility] lab this morning for her fasting blood work. Per patient Kelly Haynes is stating they don't have an order for her lab work.  Order sent over to [place].

## 2020-09-13 MED FILL — MYORISAN 40 MG CAPSULE: 40 | 30 days supply | Qty: 30 | Fill #0

## 2020-09-15 ENCOUNTER — Encounter: Payer: Self-pay | Admitting: Dermatology

## 2020-09-15 NOTE — Progress Notes (Signed)
   Follow-Up Visit   Subjective  Kelly Haynes is a 29 y.o. female who presents for the following: Acne (31 day isotretinoin f/u).  Acne Location: Mostly face Duration:  Quality: Almost clear Associated Signs/Symptoms: Modifying Factors: Isotretinoin Severity:  Timing: Context:   Objective  Well appearing patient in no apparent distress; mood and affect are within normal limits. Objective  Mid Forehead: Roughly 40% of the way to the [place] target dose, inflammatory papules mostly clear.  Moderate cheilitis.  Normal mood.   A focused examination was performed including Head and neck.. Relevant physical exam findings are noted in the Assessment and Plan.   Assessment & Plan    Acne vulgaris Mid Forehead  Continue isotretinoin- Follow up in 31 days.  Maintain I pledge compliance.  Call with any problems or questions.  ISOtretinoin (ACCUTANE) 40 MG capsule - Mid Forehead  Encounter for therapeutic drug monitoring  Reordered Medications ISOtretinoin (ACCUTANE) 40 MG capsule     I, Lavonna Monarch, MD, have reviewed all documentation for this visit.  The documentation on 09/15/20 for the exam, diagnosis, procedures, and orders are all accurate and complete.

## 2020-09-27 MED FILL — CITALOPRAM HBR 20 MG TABLET: 20 | 30 days supply | Qty: 30 | Fill #2

## 2020-10-10 ENCOUNTER — Other Ambulatory Visit (HOSPITAL_COMMUNITY): Payer: Self-pay | Admitting: Physician Assistant

## 2020-10-10 MED FILL — ADDERALL XR 20 MG CAP SA: 20 | 30 days supply | Qty: 60 | Fill #0

## 2020-10-11 ENCOUNTER — Encounter: Payer: Self-pay | Admitting: Dermatology

## 2020-10-11 MED FILL — FAMOTIDINE 40 MG TABLET: 40 | 30 days supply | Qty: 30 | Fill #7

## 2020-10-11 NOTE — Progress Notes (Signed)
   Follow-Up Visit   Subjective  Kelly Haynes is a 30 y.o. female who presents for the following: Follow-up and Acne.  Acne Location: Predominantly face Duration:  Quality: Much improved Associated Signs/Symptoms: Modifying Factors: Isotretinoin Severity:  Timing: Context:   Objective  Well appearing patient in no apparent distress; mood and affect are within normal limits. Objective  Head - Anterior (Face): Most inflammatory papules clear, some residual erythema.  Moderate cheilitis.  Understands and is compliant with I pledge.    A focused examination was performed including Neck.. Relevant physical exam findings are noted in the Assessment and Plan.   Assessment & Plan    Encounter for therapeutic drug monitoring  Other Related Procedures CBC with Differential/Platelet Comprehensive metabolic panel Lipid panel hCG, serum, qualitative  Other Related Medications ISOtretinoin (ACCUTANE) 40 MG capsule  Acne vulgaris Head - Anterior (Face)  Continue isotretinoin 40mg  once daily with largest meal.  Call me with any problems.  Return 1 month.  CBC with Differential/Platelet - Head - Anterior (Face)  Other Related Procedures Comprehensive metabolic panel Lipid panel hCG, serum, qualitative  Other Related Medications ISOtretinoin (ACCUTANE) 40 MG capsule      I, Lavonna Monarch, MD, have reviewed all documentation for this visit.  The documentation on 10/11/20 for the exam, diagnosis, procedures, and orders are all accurate and complete.

## 2020-10-16 ENCOUNTER — Ambulatory Visit: Payer: No Typology Code available for payment source | Admitting: Dermatology

## 2020-10-17 ENCOUNTER — Other Ambulatory Visit: Payer: Self-pay

## 2020-10-17 ENCOUNTER — Encounter: Payer: Self-pay | Admitting: Dermatology

## 2020-10-17 ENCOUNTER — Other Ambulatory Visit: Payer: Self-pay | Admitting: Dermatology

## 2020-10-17 ENCOUNTER — Ambulatory Visit (INDEPENDENT_AMBULATORY_CARE_PROVIDER_SITE_OTHER): Payer: No Typology Code available for payment source | Admitting: Dermatology

## 2020-10-17 DIAGNOSIS — L700 Acne vulgaris: Secondary | ICD-10-CM

## 2020-10-17 DIAGNOSIS — Z5181 Encounter for therapeutic drug level monitoring: Secondary | ICD-10-CM

## 2020-10-17 MED ORDER — ISOTRETINOIN 40 MG PO CAPS: 40.0000 mg | ORAL_CAPSULE | Freq: Every day | ORAL | 0 refills | Status: DC

## 2020-10-18 ENCOUNTER — Encounter: Payer: Self-pay | Admitting: Dermatology

## 2020-10-18 LAB — PREGNANCY, URINE: Preg Test, Ur: NEGATIVE

## 2020-10-18 NOTE — Progress Notes (Signed)
   Follow-Up Visit   Subjective  Kelly Haynes is a 30 y.o. female who presents for the following: Acne (31 day isotretinoin).  Acne Location: Predominantly face Duration:  Quality: Clear Associated Signs/Symptoms: Modifying Factors: Isotretinoin Severity:  Timing: Context:   Objective  Well appearing patient in no apparent distress; mood and affect are within normal limits. Objective  Head - Anterior (Face): Both active inflammatory papules and residual erythema essentially clear    A focused examination was performed including Head and neck.. Relevant physical exam findings are noted in the Assessment and Plan.   Assessment & Plan    Acne vulgaris Head - Anterior (Face)  Continue isotretinoin- follow up in 31 days.  Will reach approximately [place] target dose with 40 mg daily for 3 months and this is likely to be Kelly Haynes's target dose.  Continue I pledge compliance.  She knows she can call me with any questions or problems.  Other Related Procedures Pregnancy, urine  Reordered Medications ISOtretinoin (ACCUTANE) 40 MG capsule  Encounter for therapeutic drug monitoring  Reordered Medications ISOtretinoin (ACCUTANE) 40 MG capsule      I, Lavonna Monarch, MD, have reviewed all documentation for this visit.  The documentation on 10/18/20 for the exam, diagnosis, procedures, and orders are all accurate and complete.

## 2020-11-01 MED FILL — CITALOPRAM HBR 20 MG TABLET: 20 | 30 days supply | Qty: 30 | Fill #1

## 2020-11-13 MED FILL — FAMOTIDINE 40 MG TABLET: 40 | 30 days supply | Qty: 30 | Fill #8

## 2020-11-21 ENCOUNTER — Ambulatory Visit: Payer: No Typology Code available for payment source | Admitting: Dermatology

## 2020-11-29 ENCOUNTER — Other Ambulatory Visit (HOSPITAL_COMMUNITY): Payer: Self-pay | Admitting: Physician Assistant

## 2020-11-29 MED FILL — CITALOPRAM HBR 20 MG TABLET: 20 | 30 days supply | Qty: 30 | Fill #2

## 2020-11-29 MED FILL — ADDERALL XR 20 MG CAP SA: 20 | 30 days supply | Qty: 60 | Fill #0

## 2020-12-13 MED FILL — FAMOTIDINE 40 MG TABLET: 40 | 30 days supply | Qty: 30 | Fill #9

## 2020-12-21 ENCOUNTER — Other Ambulatory Visit: Payer: Self-pay

## 2020-12-21 ENCOUNTER — Encounter: Payer: Self-pay | Admitting: Obstetrics and Gynecology

## 2020-12-21 ENCOUNTER — Other Ambulatory Visit (HOSPITAL_COMMUNITY)
Admission: RE | Admit: 2020-12-21 | Discharge: 2020-12-21 | Disposition: A | Discharge status: Home / Self Care | Payer: No Typology Code available for payment source | Source: Ambulatory Visit | Attending: Obstetrics and Gynecology | Admitting: Obstetrics and Gynecology

## 2020-12-21 ENCOUNTER — Ambulatory Visit (INDEPENDENT_AMBULATORY_CARE_PROVIDER_SITE_OTHER): Payer: No Typology Code available for payment source | Admitting: Obstetrics and Gynecology

## 2020-12-21 VITALS — BP 118/75 | HR 108 | Ht 66.0 in | Wt 145.0 lb

## 2020-12-21 DIAGNOSIS — N649 Disorder of breast, unspecified: Secondary | ICD-10-CM

## 2020-12-21 DIAGNOSIS — Z124 Encounter for screening for malignant neoplasm of cervix: Secondary | ICD-10-CM | POA: Insufficient documentation

## 2020-12-21 NOTE — Progress Notes (Signed)
Concerned about spot on right nipple

## 2020-12-21 NOTE — Progress Notes (Signed)
   GYNECOLOGY OFFICE FOLLOW UP NOTE  History:  30 y.o. G1P1 here today for follow up for pap follow up after LEEP. Positive margins for CIN 2. No issues.  Also has spot on right nipple that is new.  Past Medical History:  Diagnosis Date  . Abnormal Pap smear of cervix   . Anxiety state 06/16/2015  . Depression 06/16/2015  . GERD (gastroesophageal reflux disease) 06/13/2015  . Lactose intolerance 06/16/2015    Past Surgical History:  Procedure Laterality Date  . COCCYX REMOVAL    . TONSILLECTOMY  06/16/2015  . WISDOM TOOTH EXTRACTION       Current Outpatient Medications:  .  amphetamine-dextroamphetamine (ADDERALL XR) 20 MG 24 hr capsule, Take 1 capsule (20 mg total) by mouth 2 (two) times daily. (Patient taking differently: Take 20 mg by mouth daily.), Disp: 60 capsule, Rfl: 0 .  citalopram (CELEXA) 20 MG tablet, Take 20 mg by mouth daily., Disp: , Rfl:  .  famotidine (PEPCID) 40 MG tablet, Take 1 tablet (40 mg total) by mouth every evening., Disp: 30 tablet, Rfl: 11 .  levonorgestrel (MIRENA) 20 MCG/24HR IUD, by Intrauterine route., Disp: , Rfl:  .  Multiple Vitamins-Minerals (MULTIVITAMIN PO), Take by mouth daily., Disp: , Rfl:  .  ISOtretinoin (ACCUTANE) 40 MG capsule, Take 1 capsule (40 mg total) by mouth daily., Disp: 30 capsule, Rfl: 0  The following portions of the patient's history were reviewed and updated as appropriate: allergies, current medications, past family history, past medical history, past social history, past surgical history and problem list.   Review of Systems:  Pertinent items noted in HPI and remainder of comprehensive ROS otherwise negative.   Objective:  Physical Exam BP 118/75   Pulse (!) 108   Ht 5\' 6"  (1.676 m)   Wt 145 lb (65.8 kg)   BMI 23.40 kg/m  CONSTITUTIONAL: Well-developed, well-nourished female in no acute distress.  HENT:  Normocephalic, atraumatic. External right and left ear normal. Oropharynx is clear and moist EYES: Conjunctivae  and EOM are normal. Pupils are equal, round, and reactive to light. No scleral icterus.  NECK: Normal range of motion, supple, no masses SKIN: Skin is warm and dry. No rash noted. Not diaphoretic. No erythema. No pallor. NEUROLOGIC: Alert and oriented to person, place, and time. Normal reflexes, muscle tone coordination. No cranial nerve deficit noted. PSYCHIATRIC: Normal mood and affect. Normal behavior. Normal judgment and thought content. CARDIOVASCULAR: Normal heart rate noted RESPIRATORY: Effort normal, no problems with respiration noted BREASTS: physioloigic appearing bump on right nipple ABDOMEN: Soft, no distention noted.   PELVIC: Normal appearing external genitalia; normal appearing vaginal mucosa and cervix.  No abnormal discharge noted.  Pap smear obtained.  IUD string just barely seen at external os  MUSCULOSKELETAL: Normal range of motion. No edema noted.  Exam done with chaperone present.  Labs and Imaging No results found.  Assessment & Plan:  1. Screening for cervical cancer CIN2, positive margins on LEEP IUD strings visible at external os (cut during LEEP) - Cytology - PAP( [facility])  2. Nipple lesion Appears physiologic    Routine preventative health maintenance measures emphasized. Please refer to After Visit Summary for other counseling recommendations.   Return for will contact patient with results for follow up.   Feliz Beam, MD, [place] for [employer] [facility])

## 2020-12-26 ENCOUNTER — Ambulatory Visit: Payer: No Typology Code available for payment source | Admitting: Dermatology

## 2020-12-26 LAB — CYTOLOGY - PAP
Comment: NEGATIVE
Comment: NEGATIVE
Diagnosis: NEGATIVE
HPV 16: NEGATIVE
HPV 18 / 45: NEGATIVE
High risk HPV: POSITIVE — AB

## 2021-01-02 ENCOUNTER — Other Ambulatory Visit (HOSPITAL_COMMUNITY): Payer: Self-pay

## 2021-01-02 MED ORDER — ADDERALL XR 20 MG PO CP24
20.0000 mg | ORAL_CAPSULE | Freq: Two times a day (BID) | ORAL | 0 refills | Status: DC
  Filled 2021-01-02: qty 14, 7d supply, fill #0

## 2021-01-02 MED ORDER — CITALOPRAM HYDROBROMIDE 20 MG PO TABS
20.0000 mg | ORAL_TABLET | Freq: Every day | ORAL | 0 refills | Status: DC
  Filled 2021-01-02: qty 30, 30d supply, fill #0

## 2021-01-02 MED FILL — Amphetamine-Dextroamphetamine Cap ER 24HR 20 MG: ORAL | 30 days supply | Qty: 60 | Fill #0 | Status: AC

## 2021-01-09 ENCOUNTER — Other Ambulatory Visit (HOSPITAL_COMMUNITY): Payer: Self-pay

## 2021-01-09 MED ORDER — AMPHETAMINE-DEXTROAMPHET ER 20 MG PO CP24
20.0000 mg | ORAL_CAPSULE | Freq: Two times a day (BID) | ORAL | 0 refills | Status: DC
  Filled 2021-01-09: qty 60, 30d supply, fill #0

## 2021-01-09 MED ORDER — CITALOPRAM HYDROBROMIDE 20 MG PO TABS
20.0000 mg | ORAL_TABLET | Freq: Every day | ORAL | 2 refills | Status: DC
  Filled 2021-01-09 – 2021-02-05 (×2): qty 30, 30d supply, fill #0
  Filled 2021-03-06: qty 30, 30d supply, fill #1
  Filled 2021-04-05: qty 30, 30d supply, fill #2

## 2021-01-16 MED FILL — Famotidine Tab 40 MG: ORAL | 30 days supply | Qty: 30 | Fill #0 | Status: AC

## 2021-01-17 ENCOUNTER — Other Ambulatory Visit (HOSPITAL_COMMUNITY): Payer: Self-pay

## 2021-01-30 ENCOUNTER — Ambulatory Visit: Payer: No Typology Code available for payment source | Admitting: Dermatology

## 2021-02-05 ENCOUNTER — Other Ambulatory Visit (HOSPITAL_COMMUNITY): Payer: Self-pay

## 2021-02-11 ENCOUNTER — Emergency Department
Admission: EM | Admit: 2021-02-11 | Discharge: 2021-02-11 | Disposition: A | Discharge status: Home / Self Care | Payer: No Typology Code available for payment source | Source: Home / Self Care

## 2021-02-11 ENCOUNTER — Other Ambulatory Visit: Payer: Self-pay

## 2021-02-11 DIAGNOSIS — J040 Acute laryngitis: Secondary | ICD-10-CM

## 2021-02-11 DIAGNOSIS — J069 Acute upper respiratory infection, unspecified: Secondary | ICD-10-CM | POA: Diagnosis not present

## 2021-02-11 NOTE — Discharge Instructions (Addendum)
Continue with OTC medicine as needed for symptoms. Test sent out today for COVID, flu, and RSV.

## 2021-02-11 NOTE — ED Triage Notes (Signed)
Pt presents to Urgent Care with c/o cough and fever x 2 days; reports worsening today. She is hoarse. Her aunt tested positive for COVID recently. Pt reports two negative home tests; she is vaccinated.

## 2021-02-11 NOTE — ED Provider Notes (Signed)
[name]    CSN: [id number] Arrival date & time: 02/11/21  1220      History   Chief Complaint Chief Complaint  Patient presents with  . Cough  . Fever    HPI Kelly Haynes is a 30 y.o. female.   Patient presents with feeling unwell since Friday. She reports sore throat, congestion, cough, fatigue, and fever up to 100.89F. She has taken OTC medicine with some improvement. The patient reports a coworker recently had COVID and her aunt, who she saw Tuesday, also tested positive. She has done 2 home tests that were negative.      Past Medical History:  Diagnosis Date  . Abnormal Pap smear of cervix   . Anxiety state 06/16/2015  . Depression 06/16/2015  . GERD (gastroesophageal reflux disease) 06/13/2015  . Lactose intolerance 06/16/2015    Patient Active Problem List   Diagnosis Date Noted  . History of abnormal cervical Pap smear 02/28/2017  . Attention deficit hyperactivity disorder (ADHD), predominantly inattentive type 02/28/2017  . S/P tonsillectomy 06/16/2015  . Anxiety state 06/16/2015  . Depression 06/16/2015  . Lactose intolerance 06/16/2015  . Gastric polyp 06/16/2015  . IUD (intrauterine device) in place 06/16/2015  . GERD (gastroesophageal reflux disease) 06/13/2015  . Irritable bowel syndrome 01/15/2012    Past Surgical History:  Procedure Laterality Date  . COCCYX REMOVAL    . TONSILLECTOMY  06/16/2015  . WISDOM TOOTH EXTRACTION      OB History    Gravida  1   Para  1   Term      Preterm      AB      Living  1     SAB      IAB      Ectopic      Multiple      Live Births  1            Home Medications    Prior to Admission medications   Medication Sig Start Date End Date Taking? Authorizing Provider  ADDERALL XR 20 MG 24 hr capsule TAKE 1 CAPSULE BY MOUTH 2 TIMES A DAY FOR 11/08/20 10/10/20     amphetamine-dextroamphetamine (ADDERALL XR) 20 MG 24 hr capsule Take 1 capsule (20 mg total) by mouth 2 (two) times  daily. (do not fill until 03-07-21) 01/09/21     amphetamine-dextroamphetamine (ADDERALL XR) 20 MG 24 hr capsule Take 1 capsule (20 mg total) by mouth 2 (two) times daily. (do not fill until 02-06-21) 01/09/21     citalopram (CELEXA) 20 MG tablet Take 1 oral tablet once a day 01/09/21     famotidine (PEPCID) 40 MG tablet TAKE 1 TABLET BY MOUTH EVERY EVENING 01/25/20 02/18/21  Ladene Artist, MD  ISOtretinoin (ACCUTANE) 40 MG capsule TAKE 1 CAPSULE (40 MG TOTAL) BY MOUTH DAILY. 10/17/20 10/17/21  Lavonna Monarch, MD  ISOtretinoin (ACCUTANE) 40 MG capsule TAKE 1 CAPSULE (40 MG TOTAL) BY MOUTH DAILY. 08/10/20 08/10/21  Lavonna Monarch, MD  levonorgestrel (MIRENA) 20 MCG/24HR IUD by Intrauterine route.    [provider]  Multiple Vitamins-Minerals (MULTIVITAMIN PO) Take by mouth daily.    [provider]    Family History Family History  Problem Relation Age of Onset  . Depression Father   . Other Father        Pre-cancerous colon polyps approx 2015  . Depression Sister   . Hypertension Maternal Grandfather   . Hypertension Paternal Grandfather   . Depression Cousin   .  Healthy Mother   . Colon cancer Neg Hx   . Esophageal cancer Neg Hx   . Rectal cancer Neg Hx   . Stomach cancer Neg Hx   . Pancreatic cancer Neg Hx   . Liver disease Neg Hx     Social History Social History   Tobacco Use  . Smoking status: Never Smoker  . Smokeless tobacco: Never Used  Vaping Use  . Vaping Use: Never used  Substance Use Topics  . Alcohol use: Not Currently  . Drug use: Never     Allergies   Lactose intolerance (gi)   Review of Systems Review of Systems  Constitutional: Positive for chills, fatigue and fever.  HENT: Positive for congestion, rhinorrhea and sore throat. Negative for ear pain.   Respiratory: Positive for cough. Negative for shortness of breath.   Gastrointestinal: Negative for diarrhea, nausea and vomiting.  Skin: Negative for rash.  Neurological: Positive for  headaches.     Physical Exam Triage Vital Signs ED Triage Vitals  Enc Vitals Group     BP 02/11/21 1338 109/71     Pulse Rate 02/11/21 1338 87     Resp 02/11/21 1338 20     Temp 02/11/21 1338 99.1 F (37.3 C)     Temp Source 02/11/21 1338 Oral     SpO2 02/11/21 1338 100 %     Weight 02/11/21 1333 140 lb (63.5 kg)     Height 02/11/21 1333 5\' 6"  (1.676 m)     Head Circumference --      Peak Flow --      Pain Score 02/11/21 1333 3     Pain Loc --      Pain Edu? --      Excl. in [place]? --    No data found.  Updated Vital Signs BP 109/71   Pulse 87   Temp 99.1 F (37.3 C) (Oral)   Resp 20   Ht 5\' 6"  (1.676 m)   Wt 140 lb (63.5 kg)   SpO2 100%   BMI 22.60 kg/m   Visual Acuity Right Eye Distance:   Left Eye Distance:   Bilateral Distance:    Right Eye Near:   Left Eye Near:    Bilateral Near:     Physical Exam Vitals and nursing note reviewed.  Constitutional:      General: She is not in acute distress. HENT:     Head: Normocephalic.     Nose: No congestion.     Mouth/Throat:     Mouth: Mucous membranes are moist.     Pharynx: Oropharynx is clear. Posterior oropharyngeal erythema present. No oropharyngeal exudate.     Comments: Hoarse voice Eyes:     Conjunctiva/sclera: Conjunctivae normal.     Pupils: Pupils are equal, round, and reactive to light.  Cardiovascular:     Rate and Rhythm: Normal rate and regular rhythm.     Heart sounds: Normal heart sounds.  Pulmonary:     Effort: Pulmonary effort is normal.     Breath sounds: Normal breath sounds.  Musculoskeletal:     Cervical back: Normal range of motion.  Lymphadenopathy:     Cervical: No cervical adenopathy.  Skin:    Findings: No rash.  Neurological:     Mental Status: She is alert.  Psychiatric:        Thought Content: Thought content normal.      UC Treatments / Results  Labs (all labs ordered are listed, but only abnormal results  are displayed) Labs Reviewed  COVID-19, FLU A+B AND RSV     EKG   Radiology No results found.  Procedures Procedures (including critical care time)  Medications Ordered in UC Medications - No data to display  Initial Impression / Assessment and Plan / UC Course  I have reviewed the triage vital signs and the nursing notes.  Pertinent labs & imaging results that were available during my care of the patient were reviewed by me and considered in my medical decision making (see chart for details).     URI, no red flags. Send out for COVID/RSV/flu.   E/M: 1 acute illness with systemic symptoms, 1 data (RSV/COVID/flu), low risk   Final Clinical Impressions(s) / UC Diagnoses   Final diagnoses:  Viral URI  Laryngitis     Discharge Instructions     Continue with OTC medicine as needed for symptoms. Test sent out today for COVID, flu, and RSV.     ED Prescriptions    None     PDMP not reviewed this encounter.   Delsa Sale, [place] 02/11/21 1420

## 2021-02-12 LAB — COVID-19, FLU A+B AND RSV
Influenza A, NAA: NOT DETECTED
Influenza B, NAA: NOT DETECTED
RSV, NAA: NOT DETECTED
SARS-CoV-2, NAA: DETECTED — AB

## 2021-02-19 ENCOUNTER — Other Ambulatory Visit: Payer: Self-pay | Admitting: Gastroenterology

## 2021-02-20 ENCOUNTER — Other Ambulatory Visit (HOSPITAL_COMMUNITY): Payer: Self-pay

## 2021-02-20 MED ORDER — FAMOTIDINE 40 MG PO TABS
40.0000 mg | ORAL_TABLET | Freq: Every evening | ORAL | 1 refills | Status: DC
  Filled 2021-02-20: qty 30, 30d supply, fill #0
  Filled 2021-03-22: qty 30, 30d supply, fill #1

## 2021-02-22 ENCOUNTER — Encounter: Payer: Self-pay | Admitting: Medical-Surgical

## 2021-02-22 ENCOUNTER — Ambulatory Visit (INDEPENDENT_AMBULATORY_CARE_PROVIDER_SITE_OTHER): Payer: No Typology Code available for payment source | Admitting: Medical-Surgical

## 2021-02-22 ENCOUNTER — Other Ambulatory Visit: Payer: Self-pay

## 2021-02-22 VITALS — BP 110/74 | HR 79 | Ht 66.0 in | Wt 144.0 lb

## 2021-02-22 DIAGNOSIS — Z0000 Encounter for general adult medical examination without abnormal findings: Secondary | ICD-10-CM

## 2021-02-22 NOTE — Patient Instructions (Signed)
Preventive Care 21-32 Years Old, Female Preventive care refers to lifestyle choices and visits with your health care provider that can promote health and wellness. This includes:  A yearly physical exam. This is also called an annual wellness visit.  Regular dental and eye exams.  Immunizations.  Screening for certain conditions.  Healthy lifestyle choices, such as: ? Eating a healthy diet. ? Getting regular exercise. ? Not using drugs or products that contain nicotine and tobacco. ? Limiting alcohol use. What can I expect for my preventive care visit? Physical exam Your health care provider may check your:  Height and weight. These may be used to calculate your BMI (body mass index). BMI is a measurement that tells if you are at a healthy weight.  Heart rate and blood pressure.  Body temperature.  Skin for abnormal spots. Counseling Your health care provider may ask you questions about your:  Past medical problems.  Family's medical history.  Alcohol, tobacco, and drug use.  Emotional well-being.  Home life and relationship well-being.  Sexual activity.  Diet, exercise, and sleep habits.  Work and work environment.  Access to firearms.  Method of birth control.  Menstrual cycle.  Pregnancy history. What immunizations do I need? Vaccines are usually given at various ages, according to a schedule. Your health care provider will recommend vaccines for you based on your age, medical history, and lifestyle or other factors, such as travel or where you work.   What tests do I need? Blood tests  Lipid and cholesterol levels. These may be checked every 5 years starting at age 20.  Hepatitis C test.  Hepatitis B test. Screening  Diabetes screening. This is done by checking your blood sugar (glucose) after you have not eaten for a while (fasting).  STD (sexually transmitted disease) testing, if you are at risk.  BRCA-related cancer screening. This may be  done if you have a family history of breast, ovarian, tubal, or peritoneal cancers.  Pelvic exam and Pap test. This may be done every 3 years starting at age 21. Starting at age 30, this may be done every 5 years if you have a Pap test in combination with an HPV test. Talk with your health care provider about your test results, treatment options, and if necessary, the need for more tests.   Follow these instructions at home: Eating and drinking  Eat a healthy diet that includes fresh fruits and vegetables, whole grains, lean protein, and low-fat dairy products.  Take vitamin and mineral supplements as recommended by your health care provider.  Do not drink alcohol if: ? Your health care provider tells you not to drink. ? You are pregnant, may be pregnant, or are planning to become pregnant.  If you drink alcohol: ? Limit how much you have to 0-1 drink a day. ? Be aware of how much alcohol is in your drink. In the [place] one drink equals one 12 oz bottle of beer (355 mL), one 5 oz glass of wine (148 mL), or one 1 oz glass of hard liquor (44 mL).   Lifestyle  Take daily care of your teeth and gums. Brush your teeth every morning and night with fluoride toothpaste. Floss one time each day.  Stay active. Exercise for at least 30 minutes 5 or more days each week.  Do not use any products that contain nicotine or tobacco, such as cigarettes, e-cigarettes, and chewing tobacco. If you need help quitting, ask your health care provider.  Do not   use drugs.  If you are sexually active, practice safe sex. Use a condom or other form of protection to prevent STIs (sexually transmitted infections).  If you do not wish to become pregnant, use a form of birth control. If you plan to become pregnant, see your health care provider for a prepregnancy visit.  Find healthy ways to cope with stress, such as: ? Meditation, yoga, or listening to music. ? Journaling. ? Talking to a trusted  person. ? Spending time with friends and family. Safety  Always wear your seat belt while driving or riding in a vehicle.  Do not drive: ? If you have been drinking alcohol. Do not ride with someone who has been drinking. ? When you are tired or distracted. ? While texting.  Wear a helmet and other protective equipment during sports activities.  If you have firearms in your house, make sure you follow all gun safety procedures.  Seek help if you have been physically or sexually abused. What's next?  Go to your health care provider once a year for an annual wellness visit.  Ask your health care provider how often you should have your eyes and teeth checked.  Stay up to date on all vaccines. This information is not intended to replace advice given to you by your health care provider. Make sure you discuss any questions you have with your health care provider. Document Revised: 05/07/2020 Document Reviewed: 05/21/2018 Elsevier Patient Education  2021 [employer]  

## 2021-02-22 NOTE — Progress Notes (Signed)
HPI: Kelly Haynes is a 30 y.o. female who  has a past medical history of Abnormal Pap smear of cervix, Anxiety state (06/16/2015), Depression (06/16/2015), GERD (gastroesophageal reflux disease) (06/13/2015), and Lactose intolerance (06/16/2015).  she presents to [facility] today, 02/22/21,  for chief complaint of: Annual physical exam  Dentist: last year, plans to go back soon Eye exam: UTD, glasses prn Exercise: Regular exercise on [employer] Diet: lactose intolerance Pap smear: UTD COVID vaccine: Done  Concerns: None  Past medical, surgical, social and family history reviewed:  Patient Active Problem List   Diagnosis Date Noted  . History of abnormal cervical Pap smear 02/28/2017  . Attention deficit hyperactivity disorder (ADHD), predominantly inattentive type 02/28/2017  . S/P tonsillectomy 06/16/2015  . Anxiety state 06/16/2015  . Depression 06/16/2015  . Lactose intolerance 06/16/2015  . Gastric polyp 06/16/2015  . IUD (intrauterine device) in place 06/16/2015  . GERD (gastroesophageal reflux disease) 06/13/2015  . Irritable bowel syndrome 01/15/2012    Past Surgical History:  Procedure Laterality Date  . COCCYX REMOVAL    . TONSILLECTOMY  06/16/2015  . WISDOM TOOTH EXTRACTION      Social History   Tobacco Use  . Smoking status: Never Smoker  . Smokeless tobacco: Never Used  Substance Use Topics  . Alcohol use: Not Currently    Family History  Problem Relation Age of Onset  . Depression Father   . Other Father        Pre-cancerous colon polyps approx 2015  . Depression Sister   . Hypertension Maternal Grandfather   . Hypertension Paternal Grandfather   . Depression Cousin   . Healthy Mother   . Colon cancer Neg Hx   . Esophageal cancer Neg Hx   . Rectal cancer Neg Hx   . Stomach cancer Neg Hx   . Pancreatic cancer Neg Hx   . Liver disease Neg Hx      Current medication list and allergy/intolerance information  reviewed:    Current Outpatient Medications  Medication Sig Dispense Refill  . ADDERALL XR 20 MG 24 hr capsule TAKE 1 CAPSULE BY MOUTH 2 TIMES A DAY FOR 11/08/20 60 capsule 0  . amphetamine-dextroamphetamine (ADDERALL XR) 20 MG 24 hr capsule Take 1 capsule (20 mg total) by mouth 2 (two) times daily. (do not fill until 03-07-21) 60 capsule 0  . amphetamine-dextroamphetamine (ADDERALL XR) 20 MG 24 hr capsule Take 1 capsule (20 mg total) by mouth 2 (two) times daily. (do not fill until 02-06-21) 60 capsule 0  . citalopram (CELEXA) 20 MG tablet Take 1 oral tablet once a day 30 tablet 2  . famotidine (PEPCID) 40 MG tablet Take 1 tablet (40 mg total) by mouth every evening. 30 tablet 1  . levonorgestrel (MIRENA) 20 MCG/24HR IUD by Intrauterine route.    . Multiple Vitamins-Minerals (MULTIVITAMIN PO) Take by mouth daily.     No current facility-administered medications for this visit.    Allergies  Allergen Reactions  . Lactose Intolerance (Gi)       Review of Systems:  Constitutional:  No  fever, no chills, No recent illness, No unintentional weight changes. No significant fatigue.   HEENT: No  headache, no vision change, no hearing change, No sore throat, No  sinus pressure  Cardiac: No  chest pain, No  pressure, No palpitations, No  Orthopnea  Respiratory:  No  shortness of breath. No  Cough  Gastrointestinal: No  abdominal pain, No  nausea, No  vomiting,  No  blood in stool, No  diarrhea, No  constipation   Musculoskeletal: No new myalgia/arthralgia  Skin: No  Rash, No other wounds/concerning lesions  Genitourinary: No  incontinence, No  abnormal genital bleeding, No abnormal genital discharge  Hem/Onc: No  easy bruising/bleeding, No  abnormal lymph node  Endocrine: No cold intolerance,  No heat intolerance. No polyuria/polydipsia/polyphagia   Neurologic: No  weakness, No  dizziness, No  slurred speech/focal weakness/facial droop  Psychiatric: No  concerns with depression, No   concerns with anxiety, No sleep problems, No mood problems  Exam:  BP 110/74   Pulse 79   Ht _0  (1.676 m)   Wt 144 lb (65.3 kg)   SpO2 98%   BMI 23.24 kg/m   Constitutional: VS see above. General Appearance: alert, well-developed, well-nourished, NAD  Eyes: Normal lids and conjunctive, non-icteric sclera  Ears, Nose, Mouth, Throat: MMM, Normal external inspection ears/nares/mouth/lips/gums. TM normal bilaterally.   Neck: No masses, trachea midline. No thyroid enlargement. No tenderness/mass appreciated. No lymphadenopathy  Respiratory: Normal respiratory effort. no wheeze, no rhonchi, no rales  Cardiovascular: S1/S2 normal, no murmur, no rub/gallop auscultated. RRR. No lower extremity edema. Pedal pulse II/IV bilaterally PT. No carotid bruit or JVD. No abdominal aortic bruit.  Gastrointestinal: Nontender, no masses. No hepatomegaly, no splenomegaly. No hernia appreciated. Bowel sounds normal. Rectal exam deferred.   Musculoskeletal: Gait normal. No clubbing/cyanosis of digits.   Neurological: Normal balance/coordination. No tremor. No cranial nerve deficit on limited exam. Motor and sensation intact and symmetric. Cerebellar reflexes intact.   Skin: warm, dry, intact. No rash/ulcer. No concerning nevi or subq nodules on limited exam.    Psychiatric: Normal judgment/insight. Normal mood and affect. Oriented x3.    No results found for this or any previous visit (from the past 72 hour(s)).  No results found.   ASSESSMENT/PLAN:   1. Annual physical exam Recent blood work reviewed with no concerns found.  Up-to-date on preventative care.  Plans to schedule for a dental cleaning to get back on schedule.  Sees OB/GYN for her women's health care needs.  ADHD/mood medications managed by Lyn Henri, PA.    No orders of the defined types were placed in this encounter.   No orders of the defined types were placed in this encounter.   Patient Instructions   Preventive  Care 62-37 Years Old, Female Preventive care refers to lifestyle choices and visits with your health care provider that can promote health and wellness. This includes:  A yearly physical exam. This is also called an annual wellness visit.  Regular dental and eye exams.  Immunizations.  Screening for certain conditions.  Healthy lifestyle choices, such as: ? Eating a healthy diet. ? Getting regular exercise. ? Not using drugs or products that contain nicotine and tobacco. ? Limiting alcohol use. What can I expect for my preventive care visit? Physical exam Your health care provider may check your:  Height and weight. These may be used to calculate your BMI (body mass index). BMI is a measurement that tells if you are at a healthy weight.  Heart rate and blood pressure.  Body temperature.  Skin for abnormal spots. Counseling Your health care provider may ask you questions about your:  Past medical problems.  Family's medical history.  Alcohol, tobacco, and drug use.  Emotional well-being.  Home life and relationship well-being.  Sexual activity.  Diet, exercise, and sleep habits.  Work and work Statistician.  Access to firearms.  Method  of birth control.  Menstrual cycle.  Pregnancy history. What immunizations do I need? Vaccines are usually given at various ages, according to a schedule. Your health care provider will recommend vaccines for you based on your age, medical history, and lifestyle or other factors, such as travel or where you work.   What tests do I need? Blood tests  Lipid and cholesterol levels. These may be checked every 5 years starting at age 59.  Hepatitis C test.  Hepatitis B test. Screening  Diabetes screening. This is done by checking your blood sugar (glucose) after you have not eaten for a while (fasting).  STD (sexually transmitted disease) testing, if you are at risk.  BRCA-related cancer screening. This may be done if you  have a family history of breast, ovarian, tubal, or peritoneal cancers.  Pelvic exam and Pap test. This may be done every 3 years starting at age 25. Starting at age 68, this may be done every 5 years if you have a Pap test in combination with an HPV test. Talk with your health care provider about your test results, treatment options, and if necessary, the need for more tests.   Follow these instructions at home: Eating and drinking  Eat a healthy diet that includes fresh fruits and vegetables, whole grains, lean protein, and low-fat dairy products.  Take vitamin and mineral supplements as recommended by your health care provider.  Do not drink alcohol if: ? Your health care provider tells you not to drink. ? You are pregnant, may be pregnant, or are planning to become pregnant.  If you drink alcohol: ? Limit how much you have to 0-1 drink a day. ? Be aware of how much alcohol is in your drink. In the [place] one drink equals one 12 oz bottle of beer (355 mL), one 5 oz glass of wine (148 mL), or one 1 oz glass of hard liquor (44 mL).   Lifestyle  Take daily care of your teeth and gums. Brush your teeth every morning and night with fluoride toothpaste. Floss one time each day.  Stay active. Exercise for at least 30 minutes 5 or more days each week.  Do not use any products that contain nicotine or tobacco, such as cigarettes, e-cigarettes, and chewing tobacco. If you need help quitting, ask your health care provider.  Do not use drugs.  If you are sexually active, practice safe sex. Use a condom or other form of protection to prevent STIs (sexually transmitted infections).  If you do not wish to become pregnant, use a form of birth control. If you plan to become pregnant, see your health care provider for a prepregnancy visit.  Find healthy ways to cope with stress, such as: ? Meditation, yoga, or listening to music. ? Journaling. ? Talking to a trusted person. ? Spending time with  friends and family. Safety  Always wear your seat belt while driving or riding in a vehicle.  Do not drive: ? If you have been drinking alcohol. Do not ride with someone who has been drinking. ? When you are tired or distracted. ? While texting.  Wear a helmet and other protective equipment during sports activities.  If you have firearms in your house, make sure you follow all gun safety procedures.  Seek help if you have been physically or sexually abused. What's next?  Go to your health care provider once a year for an annual wellness visit.  Ask your health care provider how often you should  have your eyes and teeth checked.  Stay up to date on all vaccines. This information is not intended to replace advice given to you by your health care provider. Make sure you discuss any questions you have with your health care provider. Document Revised: 05/07/2020 Document Reviewed: 05/21/2018 Elsevier Patient Education  2021 [employer].  Follow-up plan: Return in about 1 year (around 02/22/2022) for annual physical exam or sooner if needed.  Clearnce Sorrel, DNP, APRN, FNP-BC [facility]

## 2021-03-06 ENCOUNTER — Other Ambulatory Visit (HOSPITAL_COMMUNITY): Payer: Self-pay

## 2021-03-23 ENCOUNTER — Other Ambulatory Visit (HOSPITAL_COMMUNITY): Payer: Self-pay

## 2021-04-06 ENCOUNTER — Other Ambulatory Visit (HOSPITAL_COMMUNITY): Payer: Self-pay

## 2021-04-08 ENCOUNTER — Other Ambulatory Visit: Payer: Self-pay

## 2021-04-08 ENCOUNTER — Emergency Department
Admission: RE | Admit: 2021-04-08 | Discharge: 2021-04-08 | Disposition: A | Discharge status: Home / Self Care | Payer: No Typology Code available for payment source | Source: Ambulatory Visit

## 2021-04-08 VITALS — BP 113/76 | HR 84 | Temp 99.0°F | Resp 20 | Ht 66.0 in | Wt 140.0 lb

## 2021-04-08 DIAGNOSIS — J0100 Acute maxillary sinusitis, unspecified: Secondary | ICD-10-CM | POA: Diagnosis not present

## 2021-04-08 DIAGNOSIS — J029 Acute pharyngitis, unspecified: Secondary | ICD-10-CM | POA: Diagnosis not present

## 2021-04-08 MED ORDER — AMOXICILLIN-POT CLAVULANATE 875-125 MG PO TABS: 1.0000 | ORAL_TABLET | Freq: Two times a day (BID) | ORAL | 0 refills | Status: AC

## 2021-04-08 NOTE — ED Provider Notes (Signed)
[facility]    CSN: [id number] Arrival date & time: 04/08/21  1003      History   Chief Complaint Chief Complaint  Patient presents with   Nasal Congestion   Sore Throat    HPI Kelly Haynes is a 30 y.o. female.   HPI 30 year old female presents with nasal congestion for 2 weeks and sore throat for 4 days.  Patient reports having COVID-19 2 months ago and has not performed recent COVID-19 test.  Past Medical History:  Diagnosis Date   Abnormal Pap smear of cervix    Anxiety state 06/16/2015   Depression 06/16/2015   GERD (gastroesophageal reflux disease) 06/13/2015   Lactose intolerance 06/16/2015    Patient Active Problem List   Diagnosis Date Noted   History of abnormal cervical Pap smear 02/28/2017   Attention deficit hyperactivity disorder (ADHD), predominantly inattentive type 02/28/2017   S/P tonsillectomy 06/16/2015   Anxiety state 06/16/2015   Depression 06/16/2015   Lactose intolerance 06/16/2015   Gastric polyp 06/16/2015   IUD (intrauterine device) in place 06/16/2015   GERD (gastroesophageal reflux disease) 06/13/2015   Irritable bowel syndrome 01/15/2012    Past Surgical History:  Procedure Laterality Date   COCCYX REMOVAL     TONSILLECTOMY  06/16/2015   WISDOM TOOTH EXTRACTION      OB History     Gravida  1   Para  1   Term      Preterm      AB      Living  1      SAB      IAB      Ectopic      Multiple      Live Births  1            Home Medications    Prior to Admission medications   Medication Sig Start Date End Date Taking? Authorizing Provider  amoxicillin-clavulanate (AUGMENTIN) 875-125 MG tablet Take 1 tablet by mouth every 12 (twelve) hours for 5 days. 04/08/21 04/13/21 Yes Eliezer Lofts, FNP  ibuprofen (ADVIL) 200 MG tablet Take 200 mg by mouth every 6 (six) hours as needed.   Yes [provider]  ADDERALL XR 20 MG 24 hr capsule TAKE 1 CAPSULE BY MOUTH 2 TIMES A DAY FOR 11/08/20 10/10/20      amphetamine-dextroamphetamine (ADDERALL XR) 20 MG 24 hr capsule Take 1 capsule (20 mg total) by mouth 2 (two) times daily. (do not fill until 03-07-21) 01/09/21     amphetamine-dextroamphetamine (ADDERALL XR) 20 MG 24 hr capsule Take 1 capsule (20 mg total) by mouth 2 (two) times daily. (do not fill until 02-06-21) 01/09/21     citalopram (CELEXA) 20 MG tablet Take 1 oral tablet once a day 01/09/21     famotidine (PEPCID) 40 MG tablet Take 1 tablet (40 mg total) by mouth every evening. 02/20/21   Ladene Artist, MD  levonorgestrel (MIRENA) 20 MCG/24HR IUD by Intrauterine route.    [provider]  Multiple Vitamins-Minerals (MULTIVITAMIN PO) Take by mouth daily.    [provider]    Family History Family History  Problem Relation Age of Onset   Depression Father    Other Father        Pre-cancerous colon polyps approx 2015   Depression Sister    Hypertension Maternal Grandfather    Hypertension Paternal Grandfather    Depression Cousin    Healthy Mother    Colon cancer Neg Hx    Esophageal  cancer Neg Hx    Rectal cancer Neg Hx    Stomach cancer Neg Hx    Pancreatic cancer Neg Hx    Liver disease Neg Hx     Social History Social History   Tobacco Use   Smoking status: Never   Smokeless tobacco: Never  Vaping Use   Vaping Use: Never used  Substance Use Topics   Alcohol use: Not Currently   Drug use: Never     Allergies   Lactose intolerance (gi)   Review of Systems Review of Systems  HENT:  Positive for congestion and sore throat.   All other systems reviewed and are negative.   Physical Exam Triage Vital Signs ED Triage Vitals  Enc Vitals Group     BP 04/08/21 1029 113/76     Pulse Rate 04/08/21 1029 84     Resp 04/08/21 1029 20     Temp 04/08/21 1029 99 F (37.2 C)     Temp src --      SpO2 04/08/21 1029 98 %     Weight 04/08/21 1025 140 lb (63.5 kg)     Height 04/08/21 1025 5\' 6"  (1.676 m)     Head Circumference --      Peak Flow --       Pain Score 04/08/21 1025 3     Pain Loc --      Pain Edu? --      Excl. in [place]? --    No data found.  Updated Vital Signs BP 113/76   Pulse 84   Temp 99 F (37.2 C)   Resp 20   Ht 5\' 6"  (1.676 m)   Wt 140 lb (63.5 kg)   SpO2 98%   BMI 22.60 kg/m       Physical Exam Vitals and nursing note reviewed.  Constitutional:      General: She is not in acute distress.    Appearance: Normal appearance. She is normal weight. She is not ill-appearing.  HENT:     Head: Normocephalic and atraumatic.     Right Ear: Tympanic membrane, ear canal and external ear normal.     Left Ear: Tympanic membrane, ear canal and external ear normal.     Nose: Nose normal.     Mouth/Throat:     Mouth: Mucous membranes are moist.     Pharynx: Oropharynx is clear. Posterior oropharyngeal erythema present.  Eyes:     Extraocular Movements: Extraocular movements intact.     Conjunctiva/sclera: Conjunctivae normal.     Pupils: Pupils are equal, round, and reactive to light.  Cardiovascular:     Rate and Rhythm: Normal rate and regular rhythm.     Pulses: Normal pulses.     Heart sounds: Normal heart sounds.  Pulmonary:     Effort: Pulmonary effort is normal. No respiratory distress.     Breath sounds: Rhonchi present. No wheezing or rales.  Musculoskeletal:        General: Normal range of motion.     Cervical back: Normal range of motion and neck supple. No tenderness.  Lymphadenopathy:     Cervical: No cervical adenopathy.  Skin:    General: Skin is warm and dry.  Neurological:     General: No focal deficit present.     Mental Status: She is alert and oriented to person, place, and time.  Psychiatric:        Mood and Affect: Mood normal.        Behavior:  Behavior normal.        Thought Content: Thought content normal.     UC Treatments / Results  Labs (all labs ordered are listed, but only abnormal results are displayed) Labs Reviewed - No data to display  EKG   Radiology No  results found.  Procedures Procedures (including critical care time)  Medications Ordered in UC Medications - No data to display  Initial Impression / Assessment and Plan / UC Course  I have reviewed the triage vital signs and the nursing notes.  Pertinent labs & imaging results that were available during my care of the patient were reviewed by me and considered in my medical decision making (see chart for details).     MDM: 1.  Acute pharyngitis-Rx'd Augmentin, 2.  Subacute maxillary sinusitis-Rx'd Augmentin will cover as well.  Patient discharged home, hemodynamically stable. Final Clinical Impressions(s) / UC Diagnoses   Final diagnoses:  Acute pharyngitis, unspecified etiology  Subacute maxillary sinusitis     Discharge Instructions      Advised/instructed patient to take medication as directed with food to completion.  Encourage patient increase daily water intake while taking medication.     ED Prescriptions     Medication Sig Dispense Auth. Provider   amoxicillin-clavulanate (AUGMENTIN) 875-125 MG tablet Take 1 tablet by mouth every 12 (twelve) hours for 5 days. 10 tablet Eliezer Lofts, FNP      PDMP not reviewed this encounter.   Eliezer Lofts, [place] 04/08/21 1151

## 2021-04-08 NOTE — Discharge Instructions (Addendum)
Advised/instructed patient to take medication as directed with food to completion.  Encourage patient increase daily water intake while taking medication.

## 2021-04-08 NOTE — ED Triage Notes (Signed)
Pt presents to Urgent Care with c/o nasal congestion x 2 weeks and sore throat x 4 days. Reports having COVID two months ago; has not done recent COVID test.

## 2021-04-10 ENCOUNTER — Other Ambulatory Visit (HOSPITAL_COMMUNITY): Payer: Self-pay

## 2021-04-10 MED ORDER — AMPHETAMINE-DEXTROAMPHET ER 20 MG PO CP24: 20.0000 mg | ORAL_CAPSULE | Freq: Two times a day (BID) | ORAL | 0 refills | Status: DC

## 2021-04-10 MED ORDER — AMPHETAMINE-DEXTROAMPHET ER 20 MG PO CP24
20.0000 mg | ORAL_CAPSULE | Freq: Two times a day (BID) | ORAL | 0 refills | Status: DC
  Filled 2021-04-10: qty 60, 30d supply, fill #0

## 2021-04-10 MED ORDER — AMPHETAMINE-DEXTROAMPHET ER 20 MG PO CP24
20.0000 mg | ORAL_CAPSULE | Freq: Two times a day (BID) | ORAL | 0 refills | Status: DC
  Filled 2021-05-18: qty 60, 30d supply, fill #0

## 2021-04-10 MED ORDER — CITALOPRAM HYDROBROMIDE 20 MG PO TABS
30.0000 mg | ORAL_TABLET | Freq: Every day | ORAL | 2 refills | Status: DC
  Filled 2021-04-10 – 2021-04-24 (×2): qty 45, 30d supply, fill #0
  Filled 2021-05-28: qty 45, 30d supply, fill #1
  Filled 2021-06-28: qty 45, 30d supply, fill #2

## 2021-04-12 ENCOUNTER — Other Ambulatory Visit: Payer: Self-pay

## 2021-04-12 ENCOUNTER — Ambulatory Visit (INDEPENDENT_AMBULATORY_CARE_PROVIDER_SITE_OTHER): Payer: No Typology Code available for payment source | Admitting: Obstetrics and Gynecology

## 2021-04-12 ENCOUNTER — Encounter: Payer: Self-pay | Admitting: Obstetrics and Gynecology

## 2021-04-12 VITALS — BP 119/77 | HR 81 | Ht 66.0 in | Wt 143.0 lb

## 2021-04-12 DIAGNOSIS — Z9889 Other specified postprocedural states: Secondary | ICD-10-CM | POA: Diagnosis not present

## 2021-04-12 DIAGNOSIS — Z01419 Encounter for gynecological examination (general) (routine) without abnormal findings: Secondary | ICD-10-CM | POA: Diagnosis not present

## 2021-04-12 NOTE — Progress Notes (Signed)
GYNECOLOGY ANNUAL PREVENTATIVE CARE ENCOUNTER NOTE  Subjective:   Kelly Haynes is a 30 y.o. G1P1 female here for a annual gynecologic exam. Current complaints: none. Has occasional bleeding on IUD but not bothersome.    Denies abnormal vaginal bleeding, discharge, pelvic pain, problems with intercourse or other gynecologic concerns. Declines STI screen.   Gynecologic History No LMP recorded. (Menstrual status: IUD). Contraception: IUD Last Pap: 11/2020. Results: normal, positive hrHPV s/p LEEP Last mammogram: n/a] Gardisil: has received  Obstetric History OB History  Gravida Para Term Preterm AB Living  1 1       1   SAB IAB Ectopic Multiple Live Births          1    # Outcome Date GA Lbr Len/2nd Weight Sex Delivery Anes PTL Lv  1 Para 06/30/18    F Vag-Spont       Past Medical History:  Diagnosis Date   Abnormal Pap smear of cervix    Anxiety state 06/16/2015   Depression 06/16/2015   GERD (gastroesophageal reflux disease) 06/13/2015   Lactose intolerance 06/16/2015    Past Surgical History:  Procedure Laterality Date   COCCYX REMOVAL     TONSILLECTOMY  06/16/2015   WISDOM TOOTH EXTRACTION      Current Outpatient Medications on File Prior to Visit  Medication Sig Dispense Refill   ADDERALL XR 20 MG 24 hr capsule TAKE 1 CAPSULE BY MOUTH 2 TIMES A DAY FOR 11/08/20 60 capsule 0   amoxicillin-clavulanate (AUGMENTIN) 875-125 MG tablet Take 1 tablet by mouth every 12 (twelve) hours for 5 days. 10 tablet 0   amphetamine-dextroamphetamine (ADDERALL XR) 20 MG 24 hr capsule Take 1 capsule (20 mg total) by mouth 2 (two) times daily. (do not fill until 03-07-21) 60 capsule 0   amphetamine-dextroamphetamine (ADDERALL XR) 20 MG 24 hr capsule Take 1 capsule (20 mg total) by mouth 2 (two) times daily. (do not fill until 02-06-21) 60 capsule 0   amphetamine-dextroamphetamine (ADDERALL XR) 20 MG 24 hr capsule Take 1 capsule (20 mg total) by mouth 2 (two) times daily. 60 capsule 0   [START  ON 06/07/2021] amphetamine-dextroamphetamine (ADDERALL XR) 20 MG 24 hr capsule Take 1 capsule (20 mg total) by mouth 2 (two) times daily. (DNF until 06/07/21) 60 capsule 0   [START ON 05/09/2021] amphetamine-dextroamphetamine (ADDERALL XR) 20 MG 24 hr capsule Take 1 capsule (20 mg total) by mouth 2 (two) times daily. DNF until 05/09/21 60 capsule 0   citalopram (CELEXA) 20 MG tablet Take 1.5 tablets (30 mg total) by mouth daily. 45 tablet 2   famotidine (PEPCID) 40 MG tablet Take 1 tablet (40 mg total) by mouth every evening. 30 tablet 1   ibuprofen (ADVIL) 200 MG tablet Take 200 mg by mouth every 6 (six) hours as needed.     levonorgestrel (MIRENA) 20 MCG/24HR IUD by Intrauterine route.     Multiple Vitamins-Minerals (MULTIVITAMIN PO) Take by mouth daily.     No current facility-administered medications on file prior to visit.    Allergies  Allergen Reactions   Lactose Intolerance (Gi)     Social History   Socioeconomic History   Marital status: Single    Spouse name: Not on file   Number of children: 0   Years of education: BSN   Highest education level: Not on file  Occupational History   Occupation: CICU RN    Comment: [employer]  Tobacco Use   Smoking status: Never   Smokeless tobacco:  Never  Vaping Use   Vaping Use: Never used  Substance and Sexual Activity   Alcohol use: Not Currently   Drug use: Never   Sexual activity: Yes    Partners: Male    Birth control/protection: I.U.D.  Other Topics Concern   Not on file  Social History Narrative   Lives with a roommate, also a Marine scientist.   Mother lives in [place], [place].   Father lives in [place], [place].   Smoke detectors in home? Yes    Guns in home? No    Consistent seatbelt use? Yes    Consistent dental brushing and flossing? Yes    Semi-Annual dental visits: Yes    Annual Eye exams: last visit 01/2017   Social Determinants of Health   Financial Resource Strain: Not on file  Food Insecurity: Not on file  Transportation  Needs: Not on file  Physical Activity: Not on file  Stress: Not on file  Social Connections: Not on file  Intimate Partner Violence: Not on file    Family History  Problem Relation Age of Onset   Depression Father    Other Father        Pre-cancerous colon polyps approx 2015   Depression Sister    Hypertension Maternal Grandfather    Hypertension Paternal Grandfather    Depression Cousin    Healthy Mother    Colon cancer Neg Hx    Esophageal cancer Neg Hx    Rectal cancer Neg Hx    Stomach cancer Neg Hx    Pancreatic cancer Neg Hx    Liver disease Neg Hx    The following portions of the patient's history were reviewed and updated as appropriate: allergies, current medications, past family history, past medical history, past social history, past surgical history and problem list.  Review of Systems Pertinent items are noted in HPI.   Objective:  BP 119/77   Pulse 81   Ht 5\' 6"  (1.676 m)   Wt 143 lb (64.9 kg)   BMI 23.08 kg/m  CONSTITUTIONAL: Well-developed, well-nourished female in no acute distress.  HENT:  Normocephalic, atraumatic, External right and left ear normal. Oropharynx is clear and moist EYES: Conjunctivae and EOM are normal. Pupils are equal, round, and reactive to light. No scleral icterus.  NECK: Normal range of motion, supple, no masses.  Normal thyroid.  SKIN: Skin is warm and dry. No rash noted. Not diaphoretic. No erythema. No pallor. NEUROLOGIC: Alert and oriented to person, place, and time. Normal reflexes, muscle tone coordination. No cranial nerve deficit noted. PSYCHIATRIC: Normal mood and affect. Normal behavior. Normal judgment and thought content. CARDIOVASCULAR: Normal heart rate noted RESPIRATORY: Effort normal, no problems with respiration noted. BREASTS: Symmetric in size. No masses, skin changes, nipple drainage, or lymphadenopathy. ABDOMEN: Soft, no distention noted.  No tenderness, rebound or guarding.  PELVIC: Normal appearing external  genitalia; normal appearing vaginal mucosa and cervix.  No abnormal discharge noted.  Normal uterine size, no other palpable masses, no uterine or adnexal tenderness. MUSCULOSKELETAL: Normal range of motion. No tenderness.  No cyanosis, clubbing, or edema.  2+ distal pulses.  Exam done with chaperone present.  Assessment and Plan:   1. Well woman exam Healthy female exam IUD in place (strings not seen today, cut on LEEP but seen last visit)  2. S/P LEEP Needs repeat pap ~06/22/2021   Encouraged improvement in diet and exercise.  Declines STI screen. COVID vaccine UTD Mammogram n/a Referral for colonoscopy n/a - up to date  Flu vaccine n/a Gardisil n/a  Routine preventative health maintenance measures emphasized. Please refer to After Visit Summary for other counseling recommendations.    Feliz Beam, MD, [place] for [facility])

## 2021-04-18 ENCOUNTER — Other Ambulatory Visit (HOSPITAL_COMMUNITY): Payer: Self-pay

## 2021-04-19 ENCOUNTER — Other Ambulatory Visit (HOSPITAL_COMMUNITY): Payer: Self-pay

## 2021-04-24 ENCOUNTER — Other Ambulatory Visit: Payer: Self-pay | Admitting: Gastroenterology

## 2021-04-25 ENCOUNTER — Other Ambulatory Visit (HOSPITAL_COMMUNITY): Payer: Self-pay

## 2021-04-25 MED ORDER — FAMOTIDINE 40 MG PO TABS
40.0000 mg | ORAL_TABLET | Freq: Every evening | ORAL | 1 refills | Status: DC
  Filled 2021-04-25: qty 30, 30d supply, fill #0
  Filled 2021-05-28: qty 30, 30d supply, fill #1

## 2021-04-27 ENCOUNTER — Other Ambulatory Visit (HOSPITAL_COMMUNITY): Payer: Self-pay

## 2021-05-18 ENCOUNTER — Other Ambulatory Visit (HOSPITAL_COMMUNITY): Payer: Self-pay

## 2021-05-29 ENCOUNTER — Other Ambulatory Visit (HOSPITAL_COMMUNITY): Payer: Self-pay

## 2021-06-19 NOTE — Progress Notes (Addendum)
   GYNECOLOGY OFFICE VISIT NOTE  History:   Kelly Haynes is a 30 y.o. G1P1 here today for repap and ECC. She denies any abnormal vaginal discharge, bleeding, pelvic pain or other concerns.   Her pap history: 2012: normal pap 2014: LSIL 2016, 2019: Normal pap 2021: LSIL, HPV pos 04/2020: Colpo with CIN1 9/201: LEEP - CIN2 with positive margins 11/2020: normal pap, HPV pos   The following portions of the patient's history were reviewed and updated as appropriate: allergies, current medications, past family history, past medical history, past social history, past surgical history and problem list.    Review of Systems:  Pertinent items noted in HPI and remainder of comprehensive ROS otherwise negative.  Physical Exam:  BP 106/69   Pulse 74   Resp 16   Ht 5\' 6"  (1.676 m)   Wt 142 lb (64.4 kg)   BMI 22.92 kg/m  CONSTITUTIONAL: Well-developed, well-nourished female in no acute distress.  HEENT:  Normocephalic, atraumatic. External right and left ear normal. No scleral icterus.  NECK: Normal range of motion, supple, no masses noted on observation SKIN: No rash noted. Not diaphoretic. No erythema. No pallor. MUSCULOSKELETAL: Normal range of motion. No edema noted. NEUROLOGIC: Alert and oriented to person, place, and time. Normal muscle tone coordination. No cranial nerve deficit noted. PSYCHIATRIC: Normal mood and affect. Normal behavior. Normal judgment and thought content.  CARDIOVASCULAR: Normal heart rate noted RESPIRATORY: Effort and breath sounds normal, no problems with respiration noted ABDOMEN: No masses noted. No other overt distention noted.    PELVIC: Normal appearing external genitalia; normal urethral meatus; normal appearing vaginal mucosa and cervix.  No abnormal discharge noted.  Normal uterine size, no other palpable masses, no uterine or adnexal tenderness. No iud strings visualized (normal for her) Performed in the presence of a chaperone.   Consent for ECC  signed. Pap collected first. ECC performed. Pt tolerated procedure well.   Labs and Imaging No results found for this or any previous visit (from the past 168 hour(s)). No results found.     UPT negative Assessment and Plan:  Kelly Haynes was seen today for repeat pap and ecc.  Diagnoses and all orders for this visit:  History of abnormal cervical Pap smear -     Cytology - PAP -     Surgical pathology( [place]/ [facility]) -     POCT urine pregnancy - Pap history reviewed - For positive margins, repap and ECC done today.  - S/p leep 05/2020 - f/u pending results   Routine preventative health maintenance measures emphasized. Please refer to After Visit Summary for other counseling recommendations.   No follow-ups on file.    Radene Gunning, MD, [place] for [facility], [place]

## 2021-06-21 ENCOUNTER — Encounter: Payer: Self-pay | Admitting: Obstetrics and Gynecology

## 2021-06-21 ENCOUNTER — Ambulatory Visit: Payer: No Typology Code available for payment source | Admitting: Obstetrics and Gynecology

## 2021-06-21 ENCOUNTER — Other Ambulatory Visit (HOSPITAL_COMMUNITY)
Admission: RE | Admit: 2021-06-21 | Discharge: 2021-06-21 | Disposition: A | Discharge status: Home / Self Care | Payer: No Typology Code available for payment source | Source: Ambulatory Visit | Attending: Obstetrics and Gynecology | Admitting: Obstetrics and Gynecology

## 2021-06-21 ENCOUNTER — Other Ambulatory Visit: Payer: Self-pay

## 2021-06-21 VITALS — BP 106/69 | HR 74 | Resp 16 | Ht 66.0 in | Wt 142.0 lb

## 2021-06-21 DIAGNOSIS — Z8742 Personal history of other diseases of the female genital tract: Secondary | ICD-10-CM | POA: Insufficient documentation

## 2021-06-21 DIAGNOSIS — R87810 Cervical high risk human papillomavirus (HPV) DNA test positive: Secondary | ICD-10-CM

## 2021-06-21 DIAGNOSIS — Z01812 Encounter for preprocedural laboratory examination: Secondary | ICD-10-CM | POA: Diagnosis not present

## 2021-06-21 LAB — POCT URINE PREGNANCY: Preg Test, Ur: NEGATIVE

## 2021-06-26 LAB — CYTOLOGY - PAP
Comment: NEGATIVE
Comment: NEGATIVE
HPV 16: NEGATIVE
HPV 18 / 45: NEGATIVE
High risk HPV: POSITIVE — AB

## 2021-06-26 LAB — SURGICAL PATHOLOGY

## 2021-06-28 ENCOUNTER — Other Ambulatory Visit: Payer: Self-pay | Admitting: Gastroenterology

## 2021-06-29 ENCOUNTER — Other Ambulatory Visit (HOSPITAL_COMMUNITY): Payer: Self-pay

## 2021-06-29 MED ORDER — FAMOTIDINE 40 MG PO TABS
40.0000 mg | ORAL_TABLET | Freq: Every evening | ORAL | 0 refills | Status: DC
  Filled 2021-06-29: qty 30, 30d supply, fill #0

## 2021-07-12 ENCOUNTER — Other Ambulatory Visit (HOSPITAL_COMMUNITY): Payer: Self-pay

## 2021-07-12 MED ORDER — AMPHETAMINE-DEXTROAMPHET ER 20 MG PO CP24
20.0000 mg | ORAL_CAPSULE | Freq: Two times a day (BID) | ORAL | 0 refills | Status: DC
  Filled 2021-07-12: qty 60, 30d supply, fill #0

## 2021-07-12 MED ORDER — AMPHETAMINE-DEXTROAMPHET ER 20 MG PO CP24: 20.0000 mg | ORAL_CAPSULE | Freq: Two times a day (BID) | ORAL | 0 refills | Status: DC

## 2021-07-12 MED ORDER — CITALOPRAM HYDROBROMIDE 20 MG PO TABS
30.0000 mg | ORAL_TABLET | Freq: Every day | ORAL | 2 refills | Status: DC
  Filled 2021-07-12 – 2021-07-31 (×2): qty 45, 30d supply, fill #0
  Filled 2021-09-23: qty 45, 30d supply, fill #1

## 2021-07-16 ENCOUNTER — Other Ambulatory Visit (HOSPITAL_COMMUNITY): Payer: Self-pay

## 2021-07-26 ENCOUNTER — Encounter: Payer: Self-pay | Admitting: Obstetrics and Gynecology

## 2021-07-26 ENCOUNTER — Other Ambulatory Visit (HOSPITAL_COMMUNITY)
Admission: RE | Admit: 2021-07-26 | Discharge: 2021-07-26 | Disposition: A | Discharge status: Home / Self Care | Payer: No Typology Code available for payment source | Source: Ambulatory Visit | Attending: Obstetrics and Gynecology | Admitting: Obstetrics and Gynecology

## 2021-07-26 ENCOUNTER — Ambulatory Visit (INDEPENDENT_AMBULATORY_CARE_PROVIDER_SITE_OTHER): Payer: No Typology Code available for payment source | Admitting: Obstetrics and Gynecology

## 2021-07-26 ENCOUNTER — Other Ambulatory Visit: Payer: Self-pay

## 2021-07-26 VITALS — BP 112/77 | HR 84 | Ht 66.0 in | Wt 148.0 lb

## 2021-07-26 DIAGNOSIS — Z01812 Encounter for preprocedural laboratory examination: Secondary | ICD-10-CM | POA: Diagnosis not present

## 2021-07-26 DIAGNOSIS — R87612 Low grade squamous intraepithelial lesion on cytologic smear of cervix (LGSIL): Secondary | ICD-10-CM

## 2021-07-26 LAB — POCT URINE PREGNANCY: Preg Test, Ur: NEGATIVE

## 2021-07-26 NOTE — Progress Notes (Signed)
    GYNECOLOGY OFFICE COLPOSCOPY PROCEDURE NOTE  30 y.o. G1P1 here for colposcopy for low-grade squamous intraepithelial neoplasia (LGSIL - encompassing HPV,mild dysplasia,CIN I) pap smear on 06/21/2021.   Pregnancy test:  negative Gardasil:  completed. Discussed role for HPV in cervical dysplasia, need for surveillance.  Informed consent and review of risks, benefit and alternatives performed. Written consent given.   Speculum inserted into patient's vagina assuring full view of cervix and vaginal walls. 3 swabs of vinegar solution applied to the cervix and vaginal walls and colposcope was used to observe both the cervix and vaginal walls.   Colposcopy adequate? No (due to h/o LEEP)  no visible lesions, no mosaicism, no punctation, and no abnormal vasculature; random biopsy obtained from 9 oclock.  ECC collected.  All specimens were labeled and sent to pathology.  Speculum removed.  Pt tolerated well with minimal pain and bleeding.   Patient was given post procedure instructions.  Will follow up pathology and manage accordingly; patient will be contacted with results and recommendations.  Routine preventative health maintenance measures emphasized.  She and I discussed that if she is released to f/u in one year, that we will do annual and colposcopy at the same time. We discussed the con of this is that I do more random biopsies since I won't have pap smear to guide decision making. She would prefer this.   Radene Gunning, MD, [place] for [facility], [place]

## 2021-07-31 ENCOUNTER — Other Ambulatory Visit: Payer: Self-pay | Admitting: Gastroenterology

## 2021-07-31 LAB — SURGICAL PATHOLOGY

## 2021-08-01 ENCOUNTER — Other Ambulatory Visit (HOSPITAL_COMMUNITY): Payer: Self-pay

## 2021-08-06 ENCOUNTER — Other Ambulatory Visit (HOSPITAL_COMMUNITY): Payer: Self-pay

## 2021-09-06 ENCOUNTER — Encounter: Payer: Self-pay | Admitting: Family Medicine

## 2021-09-06 ENCOUNTER — Other Ambulatory Visit: Payer: Self-pay

## 2021-09-06 ENCOUNTER — Ambulatory Visit (INDEPENDENT_AMBULATORY_CARE_PROVIDER_SITE_OTHER): Payer: No Typology Code available for payment source | Admitting: Family Medicine

## 2021-09-06 VITALS — BP 126/75 | HR 85 | Temp 97.8°F | Wt 152.4 lb

## 2021-09-06 DIAGNOSIS — M549 Dorsalgia, unspecified: Secondary | ICD-10-CM | POA: Diagnosis not present

## 2021-09-06 DIAGNOSIS — F418 Other specified anxiety disorders: Secondary | ICD-10-CM

## 2021-09-06 DIAGNOSIS — G8929 Other chronic pain: Secondary | ICD-10-CM

## 2021-09-06 DIAGNOSIS — R5383 Other fatigue: Secondary | ICD-10-CM

## 2021-09-06 NOTE — Patient Instructions (Signed)
Labs today Referral to PT Referral to counseling Follow-up with your provider regarding depression/anxiety meds or let us know if you have trouble getting in with them

## 2021-09-06 NOTE — Progress Notes (Signed)
Acute Office Visit  Subjective:    Patient ID: Kelly Haynes, female    DOB: 1991-07-14, 30 y.o.   MRN: [id number]  Chief Complaint  Patient presents with   Fatigue    HPI Patient is in today for fatigue.   Patient reports she has had chronic fatigue (not diagnosed) for as long as she can remember, but the past 4-5  months it has worsened significantly. Reports that she is being treated for anxiety/depression and following with psych. She is unsure if the worsening fatigue is making her anxiety/depression worse, or vice versa. States she has tried conservative measures including healthy diet, exercise, following with her psych provider and doing occasional virtual therapy. (She would like a referral for in-person/local counseling on a more regular basis). She feels guilty/embarrassed for being so fatigued and having to ask others to help her with daughter and housework.    FATIGUE Duration:   past 4-5 months has been more noticeable, more fatigue/anxiety Context when symptoms started:   new relationship started 4 months ago, may have added a little strain; coworker's daughter was killed in car accident  Symptoms improve with rest: no  Depressive symptoms: yes Stress/anxiety: yes Insomnia: no  Snoring: no Daytime hypersomnolence: feels like she could take a nap, but doesn't  Wakes feeling refreshed: no History of sleep study: no Dysnea on exertion:  no Orthopnea/PND: no Chest pain: no Chronic cough: no Lower extremity edema: no Arthralgias:no Myalgias: no, chronic back pain - constant soreness (upper back/neck/traps), using ibuprofen, wants a PT referral  Weakness: no Rash: no Weight loss: no   Hemoptysis: no  New medications: no  Leg swelling: no  PND: no  Melena: no  Adenopathy: no  Skin changes: no  Feeling depressed: no  Anhedonia: no  Altered appetite: no  Thyroid dysfxn?: no Periods: Mirena, - occaisonal light spotting        Past Medical History:   Diagnosis Date   Abnormal Pap smear of cervix    Anxiety state 06/16/2015   Depression 06/16/2015   GERD (gastroesophageal reflux disease) 06/13/2015   Lactose intolerance 06/16/2015    Past Surgical History:  Procedure Laterality Date   COCCYX REMOVAL     TONSILLECTOMY  06/16/2015   WISDOM TOOTH EXTRACTION      Family History  Problem Relation Age of Onset   Depression Father    Other Father        Pre-cancerous colon polyps approx 2015   Depression Sister    Hypertension Maternal Grandfather    Hypertension Paternal Grandfather    Depression Cousin    Healthy Mother    Colon cancer Neg Hx    Esophageal cancer Neg Hx    Rectal cancer Neg Hx    Stomach cancer Neg Hx    Pancreatic cancer Neg Hx    Liver disease Neg Hx     Social History   Socioeconomic History   Marital status: Single    Spouse name: Not on file   Number of children: 0   Years of education: BSN   Highest education level: Not on file  Occupational History   Occupation: Management consultant    Comment: [employer]  Tobacco Use   Smoking status: Never   Smokeless tobacco: Never  Vaping Use   Vaping Use: Never used  Substance and Sexual Activity   Alcohol use: Not Currently   Drug use: Never   Sexual activity: Yes    Partners: Male    Birth control/protection:  I.U.D.  Other Topics Concern   Not on file  Social History Narrative   Lives with a roommate, also a Marine scientist.   Mother lives in [place], [place].   Father lives in [place], [place].   Smoke detectors in home? Yes    Guns in home? No    Consistent seatbelt use? Yes    Consistent dental brushing and flossing? Yes    Semi-Annual dental visits: Yes    Annual Eye exams: last visit 01/2017   Social Determinants of Health   Financial Resource Strain: Not on file  Food Insecurity: Not on file  Transportation Needs: Not on file  Physical Activity: Not on file  Stress: Not on file  Social Connections: Not on file  Intimate Partner Violence: Not on file     Outpatient Medications Prior to Visit  Medication Sig Dispense Refill   amphetamine-dextroamphetamine (ADDERALL XR) 20 MG 24 hr capsule Take 1 capsule (20 mg total) by mouth 2 (two) times daily. (DNF until 06/07/21) 60 capsule 0   citalopram (CELEXA) 20 MG tablet Take 1.5 tablets (30 mg total) by mouth daily. 45 tablet 2   ibuprofen (ADVIL) 200 MG tablet Take 200 mg by mouth every 6 (six) hours as needed.     levonorgestrel (MIRENA) 20 MCG/24HR IUD by Intrauterine route.     Multiple Vitamins-Minerals (MULTIVITAMIN PO) Take by mouth daily.     amphetamine-dextroamphetamine (ADDERALL XR) 20 MG 24 hr capsule Take 1 capsule (20 mg total) by mouth 2 (two) times daily. DNFB 08/10/21 60 capsule 0   amphetamine-dextroamphetamine (ADDERALL XR) 20 MG 24 hr capsule Take 1 capsule (20 mg total) by mouth 2 (two) times daily. 60 capsule 0   famotidine (PEPCID) 40 MG tablet Take 1 tablet (40 mg total) by mouth every evening. NEEDS APPOINTMENT FOR FURTHER REFILLS. (Patient not taking: Reported on 09/06/2021) 30 tablet 0   No facility-administered medications prior to visit.    Allergies  Allergen Reactions   Lactose Intolerance (Gi)     Review of Systems All review of systems negative except what is listed in the HPI     Objective:    Physical Exam Vitals reviewed.  Constitutional:      Appearance: Normal appearance. She is normal weight.  HENT:     Head: Normocephalic and atraumatic.  Cardiovascular:     Rate and Rhythm: Normal rate and regular rhythm.  Pulmonary:     Effort: Pulmonary effort is normal.     Breath sounds: Normal breath sounds.  Musculoskeletal:        General: Normal range of motion.     Cervical back: Normal range of motion and neck supple.     Comments: Bilateral upper traps very tense  Skin:    General: Skin is warm and dry.     Capillary Refill: Capillary refill takes less than 2 seconds.  Neurological:     General: No focal deficit present.     Mental Status:  She is alert and oriented to person, place, and time.  Psychiatric:        Mood and Affect: Mood normal.        Behavior: Behavior normal.        Thought Content: Thought content normal.        Judgment: Judgment normal.    BP 126/75 (BP Location: Left Arm, Patient Position: Sitting, Cuff Size: Normal)    Pulse 85    Temp 97.8 F (36.6 C)    Wt 152 lb  6.4 oz (69.1 kg)    SpO2 100%    BMI 24.60 kg/m  Wt Readings from Last 3 Encounters:  09/06/21 152 lb 6.4 oz (69.1 kg)  07/26/21 148 lb (67.1 kg)  06/21/21 142 lb (64.4 kg)    Health Maintenance Due  Topic Date Due   COVID-19 Vaccine (4 - Booster for Pfizer series) 09/01/2020   INFLUENZA VACCINE  04/23/2021    There are no preventive care reminders to display for this patient.   Lab Results  Component Value Date   TSH 0.65 05/31/2014   Lab Results  Component Value Date   WBC 7.3 09/12/2020   HGB 14.2 09/12/2020   HCT 41.2 09/12/2020   MCV 92.4 09/12/2020   PLT 236 09/12/2020   Lab Results  Component Value Date   NA 137 09/12/2020   K 4.3 09/12/2020   CO2 29 09/12/2020   GLUCOSE 88 09/12/2020   BUN 9 09/12/2020   CREATININE 0.77 09/12/2020   BILITOT 0.6 09/12/2020   AST 18 09/12/2020   ALT 15 09/12/2020   PROT 7.1 09/12/2020   ALBUMIN 4.9 05/31/2014   CALCIUM 10.0 09/12/2020   Lab Results  Component Value Date   CHOL 181 09/12/2020   Lab Results  Component Value Date   HDL 54 09/12/2020   Lab Results  Component Value Date   LDLCALC 98 09/12/2020   Lab Results  Component Value Date   TRIG 195 (H) 09/12/2020   Lab Results  Component Value Date   CHOLHDL 3.4 09/12/2020   No results found for: HGBA1C     Assessment & Plan:    1. Fatigue, unspecified type Labs today to ensure no other causes for her fatigue. There is definitely a depression/anxiety component as well. Continue good sleep hygiene practices, healthy diet, and exercise. - CBC with Differential/Platelet - Comprehensive metabolic  panel - TSH - Iron, TIBC and Ferritin Panel  2. Depression with anxiety Psych is managing her meds - she has an upcoming appointment with them. Adding referral for local counseling. Denies suicidal/homicidal ideation.  - Ambulatory referral to [facility]  3. Other chronic back pain Continue occasional OTC analgesics, heat, stretching. Adding PT referral.  - Ambulatory referral to Physical Therapy   Follow-up pending lab results or as needed.   Purcell Nails Olevia Bowens, DNP, FNP-C

## 2021-09-07 LAB — IRON,TIBC AND FERRITIN PANEL
%SAT: 45 % (calc) (ref 16–45)
Ferritin: 63 ng/mL (ref 16–154)
Iron: 166 ug/dL (ref 40–190)
TIBC: 366 mcg/dL (calc) (ref 250–450)

## 2021-09-07 LAB — COMPREHENSIVE METABOLIC PANEL
AG Ratio: 1.6 (calc) (ref 1.0–2.5)
ALT: 15 U/L (ref 6–29)
AST: 18 U/L (ref 10–30)
Albumin: 4.7 g/dL (ref 3.6–5.1)
Alkaline phosphatase (APISO): 61 U/L (ref 31–125)
BUN: 14 mg/dL (ref 7–25)
CO2: 27 mmol/L (ref 20–32)
Calcium: 10.2 mg/dL (ref 8.6–10.2)
Chloride: 103 mmol/L (ref 98–110)
Creat: 0.85 mg/dL (ref 0.50–0.97)
Globulin: 2.9 g/dL (calc) (ref 1.9–3.7)
Glucose, Bld: 83 mg/dL (ref 65–99)
Potassium: 4.4 mmol/L (ref 3.5–5.3)
Sodium: 139 mmol/L (ref 135–146)
Total Bilirubin: 0.5 mg/dL (ref 0.2–1.2)
Total Protein: 7.6 g/dL (ref 6.1–8.1)

## 2021-09-07 LAB — CBC WITH DIFFERENTIAL/PLATELET
Absolute Monocytes: 469 cells/uL (ref 200–950)
Basophils Absolute: 48 cells/uL (ref 0–200)
Basophils Relative: 0.7 %
Eosinophils Absolute: 61 cells/uL (ref 15–500)
Eosinophils Relative: 0.9 %
HCT: 40.3 % (ref 35.0–45.0)
Hemoglobin: 14.1 g/dL (ref 11.7–15.5)
Lymphs Abs: 2897 cells/uL (ref 850–3900)
MCH: 32 pg (ref 27.0–33.0)
MCHC: 35 g/dL (ref 32.0–36.0)
MCV: 91.4 fL (ref 80.0–100.0)
MPV: 10.8 fL (ref 7.5–12.5)
Monocytes Relative: 6.9 %
Neutro Abs: 3325 cells/uL (ref 1500–7800)
Neutrophils Relative %: 48.9 %
Platelets: 231 10*3/uL (ref 140–400)
RBC: 4.41 10*6/uL (ref 3.80–5.10)
RDW: 12.2 % (ref 11.0–15.0)
Total Lymphocyte: 42.6 %
WBC: 6.8 10*3/uL (ref 3.8–10.8)

## 2021-09-07 LAB — TSH: TSH: 1.18 mIU/L

## 2021-09-13 ENCOUNTER — Ambulatory Visit (INDEPENDENT_AMBULATORY_CARE_PROVIDER_SITE_OTHER): Payer: No Typology Code available for payment source | Admitting: Physical Therapy

## 2021-09-13 ENCOUNTER — Other Ambulatory Visit: Payer: Self-pay

## 2021-09-13 DIAGNOSIS — M542 Cervicalgia: Secondary | ICD-10-CM | POA: Diagnosis not present

## 2021-09-13 DIAGNOSIS — M6281 Muscle weakness (generalized): Secondary | ICD-10-CM | POA: Diagnosis not present

## 2021-09-13 DIAGNOSIS — R293 Abnormal posture: Secondary | ICD-10-CM | POA: Diagnosis not present

## 2021-09-13 DIAGNOSIS — M546 Pain in thoracic spine: Secondary | ICD-10-CM | POA: Diagnosis not present

## 2021-09-13 NOTE — Addendum Note (Signed)
Addended by: Malachy Moan L on: 09/13/2021 05:37 PM   Modules accepted: Orders

## 2021-09-13 NOTE — Therapy (Signed)
[place], [place], [address] Phone: [phone]   Fax:  [phone]  Physical Therapy Evaluation  Patient Details  Name: Kelly Haynes MRN: [id number] Date of Birth: 1991-08-21 Referring Provider (PT): Terrilyn Saver, NP   Encounter Date: 09/13/2021   PT End of Session - 09/13/21 1719     Visit Number 1    Number of Visits 6    Date for PT Re-Evaluation 10/25/21    Authorization Type [facility]    PT Start Time 2725    PT Stop Time 1445    PT Time Calculation (min) 40 min    Activity Tolerance Patient tolerated treatment well    Behavior During Therapy [facility] for tasks assessed/performed             Past Medical History:  Diagnosis Date   Abnormal Pap smear of cervix    Anxiety state 06/16/2015   Depression 06/16/2015   GERD (gastroesophageal reflux disease) 06/13/2015   Lactose intolerance 06/16/2015    Past Surgical History:  Procedure Laterality Date   COCCYX REMOVAL     TONSILLECTOMY  06/16/2015   WISDOM TOOTH EXTRACTION      There were no vitals filed for this visit.    Subjective Assessment - 09/13/21 1404     Subjective Pt states she has mid back pain that is now creeping into shoulders and neck. Pt has a 32 year old and she has to lift her. Pt is also a nurse as well. Has been ongoing 1.5 years. Last month she's had to take pain medication. Pt has been trying to do yoga as well but has limited time.    Limitations Lifting;House hold activities    How long can you sit comfortably? n/a    How long can you stand comfortably? n/a    How long can you walk comfortably? n/a    Patient Stated Goals Improve pain    Currently in Pain? Yes    Pain Score 2     Pain Location Back    Pain Orientation Upper;Mid;Left    Pain Descriptors / Indicators Tightness    Pain Type Chronic pain    Pain Onset More than a month ago    Aggravating Factors  Twisting, lifting    Pain Relieving  Factors Icy Hot for short term relief                [facility] PT Assessment - 09/13/21 0001       Assessment   Medical Diagnosis Back pain    Referring Provider (PT) Terrilyn Saver, NP    Prior Therapy Knee pain      Precautions   Precautions None      Restrictions   Weight Bearing Restrictions No      Home Environment   Living Environment Private residence    Living Arrangements Children    Available Help at Discharge Family      Prior Function   Vocation Full time employment    Vocation Requirements Nurse      Observation/Other Assessments   Focus on Therapeutic Outcomes (FOTO)  Will assess next session      Posture/Postural Control   Posture/Postural Control Postural limitations    Postural Limitations Rounded Shoulders;Decreased thoracic kyphosis      ROM / Strength   AROM / PROM / Strength AROM;Strength      AROM   Overall AROM Comments some tightness noted with thoracic rotation;  otherwise lumbar and thoracic mobility [facility]    AROM Assessment Site Cervical    Cervical Flexion 50    Cervical Extension 30    Cervical - Right Side Bend 45    Cervical - Left Side Bend 25    Cervical - Right Rotation 45    Cervical - Left Rotation 60      Strength   Overall Strength Comments Mid and low trap weakness R>L; R>L rhomboid weakness    Strength Assessment Site Shoulder    Right/Left Shoulder Right;Left                        Objective measurements completed on examination: See above findings.       [place] Adult PT Treatment/Exercise - 09/13/21 0001       Neck Exercises: Stretches   Upper Trapezius Stretch Right;Left;30 seconds    Levator Stretch Right;Left;30 seconds    Other Neck Stretches Doorway pec stretch x30 sec each low, mid and high      Manual Therapy   Manual therapy comments Skilled palpation and assessment for TPDN and manual work              Trigger Point Dry Needling - 09/13/21 0001     Consent Given? Yes     Education Handout Provided Yes    Muscles Treated Head and Neck Cervical multifidi    Muscles Treated Upper Quadrant Rhomboids;Middle trapezius;Lower trapezius    Cervical multifidi Response Twitch reponse elicited;Palpable increased muscle length    Rhomboids Response Palpable increased muscle length;Twitch response elicited    Lower trapezius Response Twitch response elicited;Palpable increased muscle length    Middle trapezius Response Twitch response elicited;Palpable increased muscle length                   PT Education - 09/13/21 1719     Education Details Exam findings, HEP, and POC    Person(s) Educated Patient    Methods Explanation;Demonstration;Tactile cues;Verbal cues;Handout    Comprehension Verbalized understanding;Returned demonstration;Verbal cues required;Tactile cues required                 PT Long Term Goals - 09/13/21 1731       PT LONG TERM GOAL #1   Title Pt will be independent with HEP    Time 6    Period Weeks    Status New    Target Date 10/25/21      PT LONG TERM GOAL #2   Title Pt will improve cervical AROM to [facility]    Time 6    Period Weeks    Status New    Target Date 10/25/21      PT LONG TERM GOAL #3   Title Pt will demo improved mid back strength to at least 4/5    Time 6    Period Weeks    Status New    Target Date 10/25/21      PT LONG TERM GOAL #4   Title Pt report decrease in pain by at least 50%    Time 6    Period Weeks    Status New    Target Date 10/25/21                    Plan - 09/13/21 1720     Clinical Impression Statement [name] is a 30 y/o F presenting to OPPT due to complaint of mid back pain which has worsened  recently coming into her shoulders. On assessment, pt demos decreased neck ROM with weaker R posterior shoulder girdle. Upon palpation, pt demos taut and tender rhomboids, mid/low traps, and cervical paraspinals. Pt would highly benefit from PT to improve her general  postural stability and strength to reduce her pain and multiple trigger points.    Personal Factors and Comorbidities Age;Fitness;Time since onset of injury/illness/exacerbation    Examination-Activity Limitations Reach Overhead;Caring for Others;Carry;Lift    Examination-Participation Restrictions Cleaning;Occupation;Community Activity    Stability/Clinical Decision Making Stable/Uncomplicated    Clinical Decision Making Low    Rehab Potential Good    PT Frequency 1x / week    PT Duration 6 weeks    PT Treatment/Interventions ADLs/Self Care Home Management;Cryotherapy;Electrical Stimulation;Iontophoresis 4mg /ml Dexamethasone;Moist Heat;Ultrasound;DME Instruction;Functional mobility training;Therapeutic activities;Therapeutic exercise;Balance training;Neuromuscular re-education;Manual techniques;Patient/family education;Passive range of motion;Taping    PT Next Visit Plan Assess response to TPDN and HEP. Continue manual work as needed. Work on posterior shoulder girdle strengthening and neck.    PT Home Exercise Plan Access Code [email or web address]    Consulted and Agree with Plan of Care Patient             Patient will benefit from skilled therapeutic intervention in order to improve the following deficits and impairments:  Decreased range of motion, Increased fascial restricitons, Impaired UE functional use, Pain, Decreased activity tolerance, Impaired flexibility, Decreased mobility, Decreased strength, Postural dysfunction, Improper body mechanics  Visit Diagnosis: Abnormal posture  Pain in thoracic spine  Cervicalgia  Muscle weakness (generalized)     Problem List Patient Active Problem List   Diagnosis Date Noted   History of loop electrical excision procedure (LEEP) 06/22/2020   History of abnormal cervical Pap smear 02/28/2017   Attention deficit hyperactivity disorder (ADHD), predominantly inattentive type 02/28/2017   S/P tonsillectomy 06/16/2015   Anxiety 06/16/2015    Depression with anxiety 06/16/2015   Lactose intolerance 06/16/2015   Gastric polyp 06/16/2015   IUD (intrauterine device) in place 06/16/2015   Gastro-esophageal reflux disease without esophagitis 06/13/2015   Irritable bowel syndrome 01/15/2012    [facility] April Gordy Levan, PT, DPT 09/13/2021, 5:35 PM  [facility] [place], [place], [address] Phone: [phone]   Fax:  [phone]  Name: Kelly Haynes MRN: [id number] Date of Birth: 05-20-1991

## 2021-09-17 ENCOUNTER — Encounter: Payer: Self-pay | Admitting: Medical-Surgical

## 2021-09-18 ENCOUNTER — Encounter: Payer: No Typology Code available for payment source | Admitting: Physical Therapy

## 2021-09-24 ENCOUNTER — Other Ambulatory Visit (HOSPITAL_COMMUNITY): Payer: Self-pay

## 2021-09-25 ENCOUNTER — Other Ambulatory Visit (HOSPITAL_COMMUNITY): Payer: Self-pay

## 2021-09-25 MED ORDER — AMPHETAMINE-DEXTROAMPHET ER 20 MG PO CP24
20.0000 mg | ORAL_CAPSULE | Freq: Two times a day (BID) | ORAL | 0 refills | Status: DC
  Filled 2021-09-25: qty 60, 30d supply, fill #0

## 2021-09-25 MED ORDER — CITALOPRAM HYDROBROMIDE 20 MG PO TABS
30.0000 mg | ORAL_TABLET | Freq: Every day | ORAL | 2 refills | Status: DC
  Filled 2021-09-25: qty 45, 30d supply, fill #0

## 2021-09-27 ENCOUNTER — Other Ambulatory Visit: Payer: Self-pay

## 2021-09-27 ENCOUNTER — Encounter: Payer: Self-pay | Admitting: Physical Therapy

## 2021-09-27 ENCOUNTER — Ambulatory Visit: Payer: No Typology Code available for payment source | Attending: Medical-Surgical | Admitting: Physical Therapy

## 2021-09-27 DIAGNOSIS — M549 Dorsalgia, unspecified: Secondary | ICD-10-CM | POA: Insufficient documentation

## 2021-09-27 DIAGNOSIS — M6281 Muscle weakness (generalized): Secondary | ICD-10-CM | POA: Diagnosis not present

## 2021-09-27 DIAGNOSIS — M542 Cervicalgia: Secondary | ICD-10-CM | POA: Insufficient documentation

## 2021-09-27 DIAGNOSIS — M546 Pain in thoracic spine: Secondary | ICD-10-CM | POA: Insufficient documentation

## 2021-09-27 DIAGNOSIS — R293 Abnormal posture: Secondary | ICD-10-CM | POA: Diagnosis not present

## 2021-09-27 DIAGNOSIS — G8929 Other chronic pain: Secondary | ICD-10-CM | POA: Diagnosis not present

## 2021-09-27 NOTE — Patient Instructions (Signed)
Access Code: [email or web address] URL: [email or web address] Date: 09/27/2021 Prepared by: [facility] - Outpatient Re[facility] [place]  Exercises Doorway Pec Stretch at 60 Elevation - 1 x daily - 7 x weekly - 2 sets - 15 sec hold Doorway Pec Stretch at 120 Degrees Abduction - 1 x daily - 7 x weekly - 2 sets - 15 sec hold Seated Levator Scapulae Stretch - 1 x daily - 7 x weekly - 2 sets - 15 sec hold Seated Upper Trapezius Stretch - 1 x daily - 7 x weekly - 2 sets - 15 sec hold Standing Shoulder W at Wall - 1 x daily - 7 x weekly - 3-5 reps - 5 seconds hold Standing Bilateral Low Shoulder Row with Anchored Resistance - 1 x daily - 3 x weekly - 2 sets - 10 reps - 3 seconds hold Shoulder External Rotation and Scapular Retraction with Resistance - 1 x daily - 3 x weekly - 2 sets - 10 reps - 2-3 seconds hold Sidelying Thoracic Rotation with Open Book - 1 x daily - 7 x weekly - 1 sets - 5 reps - 5-10 hold Thoracic Extension Mobilization on Foam Roll - 1 x daily - 7 x weekly - 1 sets - 2-3 reps - 10 seconds hold

## 2021-09-27 NOTE — Therapy (Signed)
[place], [place], [address] Phone: [phone]   Fax:  [phone]  Physical Therapy Treatment  Patient Details  Name: Kelly Haynes MRN: [id number] Date of Birth: Sep 10, 1991 Referring Provider (PT): Terrilyn Saver, NP   Encounter Date: 09/27/2021   PT End of Session - 09/27/21 0845     Visit Number 2    Number of Visits 6    Date for PT Re-Evaluation 10/25/21    Authorization Type [facility]    PT Start Time 8850    PT Stop Time 0933    PT Time Calculation (min) 47 min             Past Medical History:  Diagnosis Date   Abnormal Pap smear of cervix    Anxiety state 06/16/2015   Depression 06/16/2015   GERD (gastroesophageal reflux disease) 06/13/2015   Lactose intolerance 06/16/2015    Past Surgical History:  Procedure Laterality Date   COCCYX REMOVAL     TONSILLECTOMY  06/16/2015   WISDOM TOOTH EXTRACTION      There were no vitals filed for this visit.   Subjective Assessment - 09/27/21 0848     Subjective Pt reports the DN helped quite a bit; noticed muscles felt looser.  She reports soreness.  She feels worse first thing in the morning when she gets up. She has been completing stretches 2x/day.     Currently in Pain? Yes    Pain Score 2     Pain Location Back    Pain Orientation Left;Right;Upper;Mid    Pain Descriptors / Indicators Sore    Aggravating Factors  pulling pts up in bed    Pain Relieving Factors icy hot, self massage, ibuprofen                [facility] PT Assessment - 09/27/21 0001       Assessment   Medical Diagnosis Back pain    Referring Provider (PT) Terrilyn Saver, NP    Prior Therapy Knee pain             [facility] Adult PT Treatment/Exercise - 09/27/21 0001       Self-Care   Self-Care Other Self-Care Comments    Other Self-Care Comments  Pt educated on self massage techniques to mid-thoracic musculature; pt returned demo with cues.       Neck Exercises: Stretches   Upper Trapezius Stretch Right;Left;2 reps;20 seconds    Levator Stretch Right;Left;2 reps;20 seconds    Other Neck Stretches 3 position doorway stretch x 15 sec x 2 reps each position    Other Neck Stretches open book x 5-10 sec hold x 5 reps each side.      Shoulder Exercises: Standing   External Rotation Strengthening;Both;10 reps   3 sec pause in retraction   Theraband Level (Shoulder External Rotation) Level 3 (Green)    Row Strengthening;Both;10 reps   3 sec pause in retraction   Theraband Level (Shoulder Row) Level 3 (Green)      Shoulder Exercises: Stretch   Other Shoulder Stretches thoracic ext over rolled up yoga mat x 4 reps.  Thoracic ext over back of chair with hands supporting head x 3 reps of 10 sec      Manual Therapy   Manual Therapy Soft tissue mobilization;Myofascial release    Soft tissue mobilization STM to bilat thoracic erector spinae, rhomboid, levator scapula to decrease fascial restrictions and improve mobility.    Myofascial  Release MFR to bilat thoracic erector muscles                     PT Education - 09/27/21 1047     Education Details HEP updated.    Person(s) Educated Patient    Methods Explanation;Demonstration;Verbal cues;Tactile cues;Handout    Comprehension Verbalized understanding;Returned demonstration                 PT Long Term Goals - 09/13/21 1731       PT LONG TERM GOAL #1   Title Pt will be independent with HEP    Time 6    Period Weeks    Status New    Target Date 10/25/21      PT LONG TERM GOAL #2   Title Pt will improve cervical AROM to [facility]    Time 6    Period Weeks    Status New    Target Date 10/25/21      PT LONG TERM GOAL #3   Title Pt will demo improved mid back strength to at least 4/5    Time 6    Period Weeks    Status New    Target Date 10/25/21      PT LONG TERM GOAL #4   Title Pt report decrease in pain by at least 50%    Time 6    Period Weeks    Status  New    Target Date 10/25/21                   Plan - 09/27/21 1045     Clinical Impression Statement Reviewed current HEP; pt required minor cues on posture and form.  Positive response to exercises introduced today; reported decrease in pain after completion.  Further reduction of pain with STM to upper back.  Goals are ongoing.    Personal Factors and Comorbidities Age;Fitness;Time since onset of injury/illness/exacerbation    Examination-Activity Limitations Reach Overhead;Caring for Others;Carry;Lift    Examination-Participation Restrictions Cleaning;Occupation;Community Activity    Stability/Clinical Decision Making Stable/Uncomplicated    Rehab Potential Good    PT Frequency 1x / week    PT Duration 6 weeks    PT Treatment/Interventions ADLs/Self Care Home Management;Cryotherapy;Electrical Stimulation;Iontophoresis 4mg /ml Dexamethasone;Moist Heat;Ultrasound;DME Instruction;Functional mobility training;Therapeutic activities;Therapeutic exercise;Balance training;Neuromuscular re-education;Manual techniques;Patient/family education;Passive range of motion;Taping    PT Next Visit Plan Work on posterior shoulder girdle strengthening and neck. Continue manual work as needed.    PT Home Exercise Plan Access Code [email or web address]    Consulted and Agree with Plan of Care Patient             Patient will benefit from skilled therapeutic intervention in order to improve the following deficits and impairments:  Decreased range of motion, Increased fascial restricitons, Impaired UE functional use, Pain, Decreased activity tolerance, Impaired flexibility, Decreased mobility, Decreased strength, Postural dysfunction, Improper body mechanics  Visit Diagnosis: Abnormal posture  Pain in thoracic spine  Cervicalgia  Muscle weakness (generalized)     Problem List Patient Active Problem List   Diagnosis Date Noted   History of loop electrical excision procedure (LEEP) 06/22/2020    History of abnormal cervical Pap smear 02/28/2017   Attention deficit hyperactivity disorder (ADHD), predominantly inattentive type 02/28/2017   S/P tonsillectomy 06/16/2015   Anxiety 06/16/2015   Depression with anxiety 06/16/2015   Lactose intolerance 06/16/2015   Gastric polyp 06/16/2015   IUD (intrauterine device) in place 06/16/2015   Gastro-esophageal reflux disease without esophagitis  06/13/2015   Irritable bowel syndrome 01/15/2012    Kerin Perna, PTA 09/27/21 10:47 AM   [place], [place], [address] Phone: [phone]   Fax:  [phone]  Name: Kelly Haynes MRN: [id number] Date of Birth: 1991-03-19

## 2021-10-04 ENCOUNTER — Other Ambulatory Visit: Payer: Self-pay

## 2021-10-04 ENCOUNTER — Ambulatory Visit: Payer: No Typology Code available for payment source | Admitting: Physical Therapy

## 2021-10-04 DIAGNOSIS — R293 Abnormal posture: Secondary | ICD-10-CM

## 2021-10-04 DIAGNOSIS — M542 Cervicalgia: Secondary | ICD-10-CM

## 2021-10-04 DIAGNOSIS — M549 Dorsalgia, unspecified: Secondary | ICD-10-CM | POA: Diagnosis not present

## 2021-10-04 DIAGNOSIS — M546 Pain in thoracic spine: Secondary | ICD-10-CM

## 2021-10-04 NOTE — Therapy (Signed)
[place], [place], [address] Phone: [phone]   Fax:  [phone]  Physical Therapy Treatment  Patient Details  Name: Kelly Haynes MRN: [id number] Date of Birth: 11-23-1990 Referring Provider (PT): Terrilyn Saver, NP   Encounter Date: 10/04/2021   PT End of Session - 10/04/21 1017     Visit Number 3    Number of Visits 6    Date for PT Re-Evaluation 10/25/21    Authorization Type [facility] [employer] [facility]    PT Start Time 1017    PT Stop Time 1055    PT Time Calculation (min) 38 min    Activity Tolerance Patient tolerated treatment well    Behavior During Therapy [facility] for tasks assessed/performed             Past Medical History:  Diagnosis Date   Abnormal Pap smear of cervix    Anxiety state 06/16/2015   Depression 06/16/2015   GERD (gastroesophageal reflux disease) 06/13/2015   Lactose intolerance 06/16/2015    Past Surgical History:  Procedure Laterality Date   COCCYX REMOVAL     TONSILLECTOMY  06/16/2015   WISDOM TOOTH EXTRACTION      There were no vitals filed for this visit.   Subjective Assessment - 10/04/21 1018     Subjective Pt reports she continues to wake up sore from sleep position.  She has been working on Economist at work, but it is still challenging.    Currently in Pain? Yes    Pain Score 2    took meds prior to arrival.  upon waking pain was 5/10.   Pain Location Neck    Pain Orientation Right;Left;Upper;Lower    Pain Descriptors / Indicators Sore    Pain Radiating Towards into Rt shoulder    Aggravating Factors  sleep position    Pain Relieving Factors ibuprofen, icy hot                [facility] PT Assessment - 10/04/21 0001       Assessment   Medical Diagnosis Back pain    Referring Provider (PT) Terrilyn Saver, NP    Next MD Visit PRN    Prior Therapy Knee pain              [facility] Adult PT Treatment/Exercise - 10/04/21 0001       Self-Care    Other Self-Care Comments  Pt educated on changing sleep position and strategy to avoid sidesleeping with Rt arm overhead. Pt verbalized understanding      Neck Exercises: Stretches   Upper Trapezius Stretch Right;Left;1 rep;20 seconds    Levator Stretch Right;Left;1 rep      Shoulder Exercises: Standing   External Rotation Strengthening;Both;10 reps;Theraband    Theraband Level (Shoulder External Rotation) Level 2 (Red)    Flexion Right;Left;10 reps   to shoulder height   Shoulder Flexion Weight (lbs) 5    Row Strengthening;Both;10 reps   3 sec pause in retraction   Theraband Level (Shoulder Row) Level 4 (Blue)    Row Weight (lbs) 5   bent over row x 10   Diagonals Strengthening;Both;10 reps    Theraband Level (Shoulder Diagonals) Level 3 (Green)    Diagonals Limitations red too light.    Other Standing Exercises OH press with 3# x 10, 5# x 10      Shoulder Exercises: ROM/Strengthening   UBE (Upper Arm Bike) L4: 1 min forward/ 1 min backward, standing  Shoulder Exercises: Stretch   Other Shoulder Stretches 3 position doorway pec stretch x 20 sec each.  bilat bicep brachii stretch (lacing hands behind back) x 15 sec x 2 reps    Other Shoulder Stretches thoracic ext over back of chair x 3 reps      Manual Therapy   Soft tissue mobilization IASTM to bilat cervical paraspinals, upper trap and levator scapula to decrease fascial restrictions and improve mobility.                     PT Education - 10/04/21 1104     Education Details HEP updated.  issued red and blue bands for advancement    Person(s) Educated Patient    Methods Explanation;Demonstration;Verbal cues;Handout    Comprehension Verbalized understanding;Returned demonstration                 PT Long Term Goals - 10/04/21 1108       PT LONG TERM GOAL #1   Title Pt will be independent with HEP    Time 6    Period Weeks    Status On-going    Target Date 10/25/21      PT LONG TERM GOAL #2    Title Pt will improve cervical AROM to [facility]    Time 6    Period Weeks    Status On-going    Target Date 10/25/21      PT LONG TERM GOAL #3   Title Pt will demo improved mid back strength to at least 4/5    Time 6    Period Weeks    Status On-going    Target Date 10/25/21      PT LONG TERM GOAL #4   Title Pt report decrease in pain by at least 50%    Time 6    Period Weeks    Status On-going    Target Date 10/25/21                   Plan - 10/04/21 1106     Clinical Impression Statement Pt tolerated additional UE / neck strengthening exercises well, without any increase in symptoms.  Pt reported reduction of tightness in neck after IASTM.  Pt to continue working on HEP as well as Economist at work, and modifying sleep position.  Progressing towards LTGs.    Personal Factors and Comorbidities Age;Fitness;Time since onset of injury/illness/exacerbation    Examination-Activity Limitations Reach Overhead;Carry;Lift    Examination-Participation Restrictions Cleaning;Occupation;Community Activity    Stability/Clinical Decision Making Stable/Uncomplicated    Rehab Potential Good    PT Frequency 1x / week    PT Duration 6 weeks    PT Treatment/Interventions ADLs/Self Care Home Management;Cryotherapy;Electrical Stimulation;Iontophoresis 4mg /ml Dexamethasone;Moist Heat;Ultrasound;DME Instruction;Functional mobility training;Therapeutic activities;Therapeutic exercise;Balance training;Neuromuscular re-education;Manual techniques;Patient/family education;Passive range of motion;Taping    PT Next Visit Plan Work on posterior shoulder girdle strengthening and neck. Continue manual work as needed.    PT Home Exercise Plan Access Code [email or web address]    Consulted and Agree with Plan of Care Patient             Patient will benefit from skilled therapeutic intervention in order to improve the following deficits and impairments:  Decreased range of motion, Increased fascial  restricitons, Impaired UE functional use, Pain, Decreased activity tolerance, Impaired flexibility, Decreased mobility, Decreased strength, Postural dysfunction, Improper body mechanics  Visit Diagnosis: Abnormal posture  Pain in thoracic spine  Cervicalgia     Problem List Patient  Active Problem List   Diagnosis Date Noted   History of loop electrical excision procedure (LEEP) 06/22/2020   History of abnormal cervical Pap smear 02/28/2017   Attention deficit hyperactivity disorder (ADHD), predominantly inattentive type 02/28/2017   S/P tonsillectomy 06/16/2015   Anxiety 06/16/2015   Depression with anxiety 06/16/2015   Lactose intolerance 06/16/2015   Gastric polyp 06/16/2015   IUD (intrauterine device) in place 06/16/2015   Gastro-esophageal reflux disease without esophagitis 06/13/2015   Irritable bowel syndrome 01/15/2012    Kerin Perna, PTA 10/04/21 11:10 AM   [place], [place], [address] Phone: [phone]   Fax:  [phone]  Name: Kelly Haynes MRN: [id number] Date of Birth: 21-Jan-1991

## 2021-10-04 NOTE — Patient Instructions (Signed)
Access Code: [email or web address] URL: [email or web address] Date: 10/04/2021 Prepared by: [facility] - Outpatient Re[facility]  Exercises Doorway Pec Stretch at 120 Degrees Abduction - 1 x daily - 7 x weekly - 2 sets - 15 sec hold Seated Levator Scapulae Stretch - 1 x daily - 7 x weekly - 2 sets - 15 sec hold Seated Upper Trapezius Stretch - 1 x daily - 7 x weekly - 2 sets - 15 sec hold Standing Shoulder W at Wall - 1 x daily - 7 x weekly - 3-5 reps - 5 seconds hold Sidelying Thoracic Rotation with Open Book - 1 x daily - 7 x weekly - 1 sets - 5 reps - 5-10 hold Thoracic Extension Mobilization on Foam Roll - 1 x daily - 7 x weekly - 1 sets - 2-3 reps - 10 seconds hold Standing Bent Over Bilateral Shoulder Row with Dumbbells - 1 x daily - 2-3 x weekly - 2 sets - 10 reps Standing Bilateral Low Shoulder Row with Anchored Resistance - 1 x daily - 2-3 x weekly - 2 sets - 10 reps - 3 seconds hold Shoulder Overhead Press in Abduction with Dumbbells - 1 x daily - 2-3 x weekly - 2 sets - 10 reps Standing Shoulder Diagonal Horizontal Abduction 60/120 Degrees with Resistance - 1 x daily - 2-3 x weekly - 2 sets - 10 reps Shoulder External Rotation and Scapular Retraction with Resistance - 1 x daily - 2-3 x weekly - 2 sets - 10 reps - 2-3 seconds hold Standing Shoulder Flexion with Resistance - 1 x daily - 1 x weekly - 2 sets - 10 reps

## 2021-10-09 ENCOUNTER — Other Ambulatory Visit (HOSPITAL_COMMUNITY): Payer: Self-pay

## 2021-10-09 MED ORDER — AMPHETAMINE-DEXTROAMPHET ER 20 MG PO CP24
20.0000 mg | ORAL_CAPSULE | Freq: Two times a day (BID) | ORAL | 0 refills | Status: DC
  Filled 2021-10-09: qty 60, 30d supply, fill #0

## 2021-10-09 MED ORDER — FLUOXETINE HCL 20 MG PO CAPS
ORAL_CAPSULE | ORAL | 0 refills | Status: DC
  Filled 2021-10-09: qty 60, 30d supply, fill #0

## 2021-10-11 ENCOUNTER — Ambulatory Visit: Payer: No Typology Code available for payment source | Admitting: Professional

## 2021-10-18 ENCOUNTER — Encounter: Payer: Self-pay | Admitting: Physical Therapy

## 2021-10-18 ENCOUNTER — Ambulatory Visit: Payer: No Typology Code available for payment source | Admitting: Physical Therapy

## 2021-10-18 ENCOUNTER — Encounter: Payer: Self-pay | Admitting: Professional

## 2021-10-18 ENCOUNTER — Ambulatory Visit (INDEPENDENT_AMBULATORY_CARE_PROVIDER_SITE_OTHER): Payer: No Typology Code available for payment source | Admitting: Professional

## 2021-10-18 ENCOUNTER — Other Ambulatory Visit: Payer: Self-pay

## 2021-10-18 DIAGNOSIS — F900 Attention-deficit hyperactivity disorder, predominantly inattentive type: Secondary | ICD-10-CM | POA: Diagnosis not present

## 2021-10-18 DIAGNOSIS — M6281 Muscle weakness (generalized): Secondary | ICD-10-CM

## 2021-10-18 DIAGNOSIS — M546 Pain in thoracic spine: Secondary | ICD-10-CM

## 2021-10-18 DIAGNOSIS — M549 Dorsalgia, unspecified: Secondary | ICD-10-CM | POA: Diagnosis not present

## 2021-10-18 DIAGNOSIS — R293 Abnormal posture: Secondary | ICD-10-CM

## 2021-10-18 DIAGNOSIS — F411 Generalized anxiety disorder: Secondary | ICD-10-CM | POA: Insufficient documentation

## 2021-10-18 DIAGNOSIS — F331 Major depressive disorder, recurrent, moderate: Secondary | ICD-10-CM | POA: Diagnosis not present

## 2021-10-18 DIAGNOSIS — M542 Cervicalgia: Secondary | ICD-10-CM

## 2021-10-18 DIAGNOSIS — F330 Major depressive disorder, recurrent, mild: Secondary | ICD-10-CM | POA: Insufficient documentation

## 2021-10-18 NOTE — Progress Notes (Addendum)
[place] Counselor Initial Adult Exam  Name: Kelly Haynes Date: 10/18/2021 MRN: [id number] DOB: 1991/04/18 PCP: Kelly Bouche, NP  Time spent: 55 minutes 2-255 pm  Guardian/Payee:  self    Paperwork requested: No   Reason for Visit /Presenting Problem: The patient arrived on time for her in-person appointment.  The patient reports he has had anxiety and diagnosed with OCD at 31 years old. She has no more obsessive thinking but still struggles with anxiety. When she was 20-50 years old she felt depressed for several years and realized she needed help and began treatment. For the past eight months it has been ard to "do stuff every day and I get down I th afternoon". Over Christmas break while her daughter was away and she was off work she could not get off the couch, had no motivation but could not get anything done, increased isolation, chronic fatigue.  Celexa began at age 17 and has bee on for ten years. Sh has been having medication managed through [facility] [place]. She is switching to Prozac and tapering off the Celexa  Mental Status Exam: Appearance:   Neat     Behavior:  Appropriate and Sharing  Motor:  Normal  Speech/Language:   Clear and Coherent and Normal Rate  Affect:  Full Range  Mood:  normal  Thought process:  goal directed  Thought content:    WNL  Sensory/Perceptual disturbances:    WNL  Orientation:  oriented to person, place, time/date, and situation  Attention:  Good  Concentration:  Good  Memory:  WNL  Fund of knowledge:   Good  Insight:    Good  Judgment:   Good  Impulse Control:  Good   Risk Assessment: Danger to Self:  No Self-injurious Behavior: No Danger to Others: No Duty to Warn:no Physical Aggression / Violence:No  Access to Firearms a concern: No  Gang Involvement:No  Patient / guardian was educated about steps to take if suicide or homicide risk level increases between visits: no While future  psychiatric events cannot be accurately predicted, the patient does not currently require acute inpatient psychiatric care and does not currently meet [facility] involuntary commitment criteria.  Substance Abuse History: Current substance abuse:  deferred     Past Psychiatric History:   Previous psychological history is significant for anxiety and depression Outpatient Providers:was in counseling in the past History of Psych Hospitalization: No  Psychological Testing: none  Abuse History:  Victim of: No.   Report needed: No. Victim of Neglect:No. Perpetrator of n/a  Witness / Exposure to Domestic Violence: No   Protective Services Involvement: No  Witness to [employer] Violence:  No   Family History:  Family History  Problem Relation Age of Onset   Depression Father    Other Father        Pre-cancerous colon polyps approx 2015   Depression Sister    Hypertension Maternal Grandfather    Hypertension Paternal Grandfather    Depression Cousin    Healthy Mother    Colon cancer Neg Hx    Esophageal cancer Neg Hx    Rectal cancer Neg Hx    Stomach cancer Neg Hx    Pancreatic cancer Neg Hx    Liver disease Neg Hx     Living situation: the patient lives with her two year old daughter  Sexual Orientation: Straight  Relationship Status: single  Name of spouse / other:dating Kelly Haynes for past six months, he is 32 years older  than her If a parent, number of children / ages:Kelly Haynes is two years old; Kelly Haynes's father Kelly Haynes asked her parents to marry her and they told him they wanted him to get his drinking under control. She was not on birth control for a few months when she got pregnant. After she got pregnant they moved in together and fob could not hide his drinking and after the baby was born and was two months old she moved back in with her mother and step father. They get along better now and have a custody agreement that works.  The first person she has dated since Kelly Haynes is  Kelly Haynes.  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 Boyfriend is nurse Printmaker: No   Educational History: Education: Armed forces operational officer in [place] from [place]  Religion/Sprituality/World View: [employer], struggling with, father is [employer]; she has had a lot of questions recently after having always been so sure.  She has always had a peace about dying; relationship vs. religion  Any cultural differences that may affect / interfere with treatment:  not applicable   Recreation/Hobbies: paint by numbers, reading, hanging out with family, hanging out with friend as time permits  Stressors: Loss of relationship with daughter's father; it would have been so much easier for her if she was with fob    Strengths: supportive relationships, good self-care-will not let herself be down and does not want to be sad, eats pretty healthy, works her schedule, has good insight  Barriers:  none   Legal History: Pending legal issue / charges: The patient has no significant history of legal issues. History of legal issue / charges: n/a  Medical History/Surgical History: reviewed Past Medical History:  Diagnosis Date   Abnormal Pap smear of cervix    Anxiety state 06/16/2015   Depression 06/16/2015   GERD (gastroesophageal reflux disease) 06/13/2015   Lactose intolerance 06/16/2015    Past Surgical History:  Procedure Laterality Date   COCCYX REMOVAL     TONSILLECTOMY  06/16/2015   WISDOM TOOTH EXTRACTION      Medications: Current Outpatient Medications  Medication Sig Dispense Refill   amphetamine-dextroamphetamine (ADDERALL XR) 20 MG 24 hr capsule Take 1 capsule (20 mg total) by mouth 2 (two) times daily. (DNF until 06/07/21) 60 capsule 0    amphetamine-dextroamphetamine (ADDERALL XR) 20 MG 24 hr capsule Take 1 capsule (20 mg total) by mouth 2 (two) times daily. 60 capsule 0   citalopram (CELEXA) 20 MG tablet Take 1.5 tablets (30 mg total) by mouth daily. 45 tablet 2   citalopram (CELEXA) 20 MG tablet Take 1.5 tablets (30 mg total) by mouth daily. 45 tablet 2   FLUoxetine (PROZAC) 20 MG capsule Take 1 capsule (20 mg total) by mouth in the morning for 7 days, THEN 2 capsules (40 mg total) in the morning 60 capsule 0   ibuprofen (ADVIL) 200 MG tablet Take 200 mg by mouth every 6 (six) hours as needed.     levonorgestrel (MIRENA) 20 MCG/24HR IUD by Intrauterine route.     Multiple Vitamins-Minerals (MULTIVITAMIN PO) Take by mouth daily.     No current facility-administered medications for this visit.    Allergies  Allergen Reactions   Lactose Intolerance (Gi)     Diagnoses:  Attention deficit hyperactivity disorder (ADHD), predominantly inattentive  type  Generalized anxiety disorder  Major depressive disorder, recurrent episode, moderate ([place])  Plan of Care:  -treatment to include learning signs/symptoms and triggers of anxiety and how to cope -next appointment on Tuesday, October 30, 2021 at [place], [facility]

## 2021-10-18 NOTE — Therapy (Signed)
[place], [place], [address] Phone: [phone]   Fax:  [phone]  Physical Therapy Treatment and Re-Certification  Patient Details  Name: Kelly Haynes MRN: [id number] Date of Birth: Jan 08, 1991 Referring Provider (PT): Terrilyn Saver, NP   Encounter Date: 10/18/2021   PT End of Session - 10/18/21 0941     Visit Number 4    Number of Visits 6    Date for PT Re-Evaluation 10/25/21    Authorization Type [facility]    PT Start Time 8676    PT Stop Time 1015    PT Time Calculation (min) 41 min    Activity Tolerance Patient tolerated treatment well    Behavior During Therapy [facility] for tasks assessed/performed             Past Medical History:  Diagnosis Date   Abnormal Pap smear of cervix    Anxiety state 06/16/2015   Depression 06/16/2015   GERD (gastroesophageal reflux disease) 06/13/2015   Lactose intolerance 06/16/2015    Past Surgical History:  Procedure Laterality Date   COCCYX REMOVAL     TONSILLECTOMY  06/16/2015   WISDOM TOOTH EXTRACTION      There were no vitals filed for this visit.   Subjective Assessment - 10/18/21 0938     Subjective Pt reports she had massage 2 days ago and it helped her back pain (points to area beside her Rt shoulder blade) some.  Rt shoulder pain has improved.  "I've been trying really hard not to sleep with my arm up, and doing my exercises".    Patient Stated Goals Improve pain    Currently in Pain? No/denies    Pain Score 0-No pain                [facility] PT Assessment - 10/18/21 0001       Assessment   Medical Diagnosis Back pain    Referring Provider (PT) Terrilyn Saver, NP    Next MD Visit PRN    Prior Therapy Knee pain      AROM   Cervical Flexion 62    Cervical Extension 44    Cervical - Right Side Bend 45    Cervical - Left Side Bend 45    Cervical - Right Rotation 65    Cervical - Left Rotation 65      Strength    Overall Strength Comments mild low trap weakness R>L; R=L rhomboid, 3+/5               [facility] Adult PT Treatment/Exercise - 10/18/21 0001       Neck Exercises: Stretches   Upper Trapezius Stretch Right;Left;1 rep;20 seconds    Levator Stretch Right;Left;1 rep    Other Neck Stretches 3 position doorway stretch x 20 sec x 2 reps each position      Shoulder Exercises: Sidelying   Other Sidelying Exercises open book x 10 reps each side (variety- with / without resistance)      Shoulder Exercises: ROM/Strengthening   UBE (Upper Arm Bike) L4: 1.5 min forward/ 1.5 min backward, standing      Manual Therapy   Manual Therapy Joint mobilization   provided by April Nonato, PT   Joint Mobilization Seated thoracic extension manipulation (cavitation noted), seated cervico-thoracic gapping manipulation, supine thrust x2 attempts. Unilateral PA mobs on R T4-T5 grade II    Soft tissue mobilization STM to bilat upper/ mid thoracic paraspinals  PT Long Term Goals - 10/04/21 1108       PT LONG TERM GOAL #1   Title Pt will be independent with HEP    Time 6    Period Weeks    Status On-going    Target Date 10/25/21      PT LONG TERM GOAL #2   Title Pt will improve cervical AROM to [facility]    Time 6    Period Weeks    Status On-going    Target Date 10/25/21      PT LONG TERM GOAL #3   Title Pt will demo improved mid back strength to at least 4/5    Time 6    Period Weeks    Status On-going    Target Date 10/25/21      PT LONG TERM GOAL #4   Title Pt report decrease in pain by at least 50%    Time 6    Period Weeks    Status On-going    Target Date 10/25/21                   Plan - 10/18/21 1000     Clinical Impression Statement Pt responding well to exercises of HEP and modifying sleep position as able.  Per supervising PT, April Nonato,T4-5 hypomobile with unilateral PA mobs.   Neck ROM is much improved; pt has met LTG #2.   Progressing well towards remaining goals.    Personal Factors and Comorbidities Age;Fitness;Time since onset of injury/illness/exacerbation    Examination-Activity Limitations Reach Overhead;Carry;Lift    Examination-Participation Restrictions Cleaning;Occupation;Community Activity    Stability/Clinical Decision Making Stable/Uncomplicated    Rehab Potential Good    PT Frequency 1x / week    PT Duration 6 weeks    PT Treatment/Interventions ADLs/Self Care Home Management;Cryotherapy;Electrical Stimulation;Iontophoresis 15m/ml Dexamethasone;Moist Heat;Ultrasound;DME Instruction;Functional mobility training;Therapeutic activities;Therapeutic exercise;Balance training;Neuromuscular re-education;Manual techniques;Patient/family education;Passive range of motion;Taping;Spinal Manipulations;Joint Manipulations    PT Next Visit Plan Work on posterior shoulder girdle strengthening and neck. Continue manual work as needed.    PT Home Exercise Plan Access Code [email or web address]    Consulted and Agree with Plan of Care Patient             Patient will benefit from skilled therapeutic intervention in order to improve the following deficits and impairments:  Decreased range of motion, Increased fascial restricitons, Impaired UE functional use, Pain, Decreased activity tolerance, Impaired flexibility, Decreased mobility, Decreased strength, Postural dysfunction, Improper body mechanics  Visit Diagnosis: Abnormal posture  Pain in thoracic spine  Cervicalgia  Muscle weakness (generalized)     Problem List Patient Active Problem List   Diagnosis Date Noted   History of loop electrical excision procedure (LEEP) 06/22/2020   History of abnormal cervical Pap smear 02/28/2017   Attention deficit hyperactivity disorder (ADHD), predominantly inattentive type 02/28/2017   S/P tonsillectomy 06/16/2015   Anxiety 06/16/2015   Depression with anxiety 06/16/2015   Lactose intolerance 06/16/2015   Gastric polyp  06/16/2015   IUD (intrauterine device) in place 06/16/2015   Gastro-esophageal reflux disease without esophagitis 06/13/2015   Irritable bowel syndrome 01/15/2012   JKerin Perna PTA 10/18/21 1:36 PM  [facility] [place] [address] [address]Phone: [phone]  Fax:  [phone] Name: NTawona FilsingerMRN: [id number] of Birth: [birth year]

## 2021-10-18 NOTE — Progress Notes (Signed)
° ° ° ° ° ° ° ° ° ° ° ° ° ° °  Zayana Salvador, LCMHC °

## 2021-10-25 ENCOUNTER — Ambulatory Visit: Payer: No Typology Code available for payment source | Attending: Medical-Surgical | Admitting: Physical Therapy

## 2021-10-25 ENCOUNTER — Other Ambulatory Visit: Payer: Self-pay

## 2021-10-25 DIAGNOSIS — M542 Cervicalgia: Secondary | ICD-10-CM | POA: Insufficient documentation

## 2021-10-25 DIAGNOSIS — M6281 Muscle weakness (generalized): Secondary | ICD-10-CM | POA: Diagnosis present

## 2021-10-25 DIAGNOSIS — M546 Pain in thoracic spine: Secondary | ICD-10-CM | POA: Insufficient documentation

## 2021-10-25 DIAGNOSIS — R293 Abnormal posture: Secondary | ICD-10-CM | POA: Insufficient documentation

## 2021-10-25 NOTE — Therapy (Signed)
[place], [place], [address] Phone: [phone]   Fax:  [phone]  Physical Therapy Treatment  Patient Details  Name: Kelly Haynes MRN: [id number] Date of Birth: 10-Mar-1991 Referring Provider (PT): Terrilyn Saver, NP   Encounter Date: 10/25/2021   PT End of Session - 10/25/21 0925     Visit Number 5    Number of Visits 6    Date for PT Re-Evaluation 10/25/21    Authorization Type [facility]    PT Start Time 0930    PT Stop Time 1015    PT Time Calculation (min) 45 min    Activity Tolerance Patient tolerated treatment well    Behavior During Therapy [facility] for tasks assessed/performed             Past Medical History:  Diagnosis Date   Abnormal Pap smear of cervix    Anxiety state 06/16/2015   Depression 06/16/2015   GERD (gastroesophageal reflux disease) 06/13/2015   Lactose intolerance 06/16/2015    Past Surgical History:  Procedure Laterality Date   COCCYX REMOVAL     TONSILLECTOMY  06/16/2015   WISDOM TOOTH EXTRACTION      There were no vitals filed for this visit.   Subjective Assessment - 10/25/21 0932     Subjective Pt reports doing stretching yesterday so she feels pretty good. Reports continued tightness along thoracic area (L>R today). Has continued to try to sleep with her arm down.    Limitations Lifting;House hold activities    How long can you sit comfortably? n/a    How long can you stand comfortably? n/a    How long can you walk comfortably? n/a    Patient Stated Goals Improve pain    Currently in Pain? Yes    Pain Score 1                                [facility] Adult PT Treatment/Exercise - 10/25/21 0001       Shoulder Exercises: Prone   Other Prone Exercises "W" 2x10 each with 2 lb weight    Other Prone Exercises "Y" 2x10      Shoulder Exercises: Sidelying   Other Sidelying Exercises open book x 5 reps each side no resistance       Shoulder Exercises: Standing   Other Standing Exercises standing bent over single row 2x10; 9lb bar bell      Shoulder Exercises: ROM/Strengthening   UBE (Upper Arm Bike) L4: 1.5 min forward/ 1.5 min backward, standing      Manual Therapy   Manual therapy comments Skilled palpation and assessment for TPDN and manual work    Soft tissue mobilization STM to upper/ mid thoracic paraspinals,              Trigger Point Dry Needling - 10/25/21 0001     Consent Given? Yes    Education Handout Provided Previously provided    Muscles Treated Back/Hip Thoracic multifidi    Rhomboids Response Palpable increased muscle length;Twitch response elicited    Lower trapezius Response Twitch response elicited;Palpable increased muscle length    Middle trapezius Response Twitch response elicited;Palpable increased muscle length    Thoracic multifidi response Twitch response elicited;Palpable increased muscle length                        PT Long Term Goals -  10/04/21 1108       PT LONG TERM GOAL #1   Title Pt will be independent with HEP    Time 6    Period Weeks    Status On-going    Target Date 10/25/21      PT LONG TERM GOAL #2   Title Pt will improve cervical AROM to [facility]    Time 6    Period Weeks    Status On-going    Target Date 10/25/21      PT LONG TERM GOAL #3   Title Pt will demo improved mid back strength to at least 4/5    Time 6    Period Weeks    Status On-going    Target Date 10/25/21      PT LONG TERM GOAL #4   Title Pt report decrease in pain by at least 50%    Time 6    Period Weeks    Status On-going    Target Date 10/25/21                   Plan - 10/25/21 1015     Clinical Impression Statement Continues to respond well to stretching and strengthening. Pt notes improved knowledge on how to manage her symptoms -- now has a theracane to help self massage at home. Continued to work on strengthening low and mid traps where she's the  weakest. Discussed d/c next session to wrap up any questions or continued concerns.    Personal Factors and Comorbidities Age;Fitness;Time since onset of injury/illness/exacerbation    Examination-Activity Limitations Reach Overhead;Carry;Lift    Examination-Participation Restrictions Cleaning;Occupation;Community Activity    Stability/Clinical Decision Making Stable/Uncomplicated    Rehab Potential Good    PT Frequency 1x / week    PT Duration 6 weeks    PT Treatment/Interventions ADLs/Self Care Home Management;Cryotherapy;Electrical Stimulation;Iontophoresis 4mg /ml Dexamethasone;Moist Heat;Ultrasound;DME Instruction;Functional mobility training;Therapeutic activities;Therapeutic exercise;Balance training;Neuromuscular re-education;Manual techniques;Patient/family education;Passive range of motion;Taping;Spinal Manipulations;Joint Manipulations    PT Next Visit Plan Work on posterior shoulder girdle strengthening and neck. Continue manual work as needed. Go over body mechanics at work.    PT Home Exercise Plan Access Code [email or web address]    Consulted and Agree with Plan of Care Patient             Patient will benefit from skilled therapeutic intervention in order to improve the following deficits and impairments:  Decreased range of motion, Increased fascial restricitons, Impaired UE functional use, Pain, Decreased activity tolerance, Impaired flexibility, Decreased mobility, Decreased strength, Postural dysfunction, Improper body mechanics  Visit Diagnosis: Abnormal posture  Pain in thoracic spine  Cervicalgia  Muscle weakness (generalized)     Problem List Patient Active Problem List   Diagnosis Date Noted   Generalized anxiety disorder 10/18/2021   Major depressive disorder, recurrent episode, mild ([place]) 10/18/2021   Major depressive disorder, recurrent episode, moderate ([place]) 10/18/2021   History of loop electrical excision procedure (LEEP) 06/22/2020   History of abnormal  cervical Pap smear 02/28/2017   Attention deficit hyperactivity disorder (ADHD), predominantly inattentive type 02/28/2017   S/P tonsillectomy 06/16/2015   Anxiety 06/16/2015   Depression with anxiety 06/16/2015   Lactose intolerance 06/16/2015   Gastric polyp 06/16/2015   IUD (intrauterine device) in place 06/16/2015   Gastro-esophageal reflux disease without esophagitis 06/13/2015   Irritable bowel syndrome 01/15/2012    [facility] April Gordy Levan, PT, DPT 10/25/2021, 12:43 PM  [facility] [place],  [place], [address] Phone: [phone]   Fax:  [phone]  Name: Patina Spanier MRN: [id number] Date of Birth: May 26, 1991

## 2021-10-30 ENCOUNTER — Ambulatory Visit (INDEPENDENT_AMBULATORY_CARE_PROVIDER_SITE_OTHER): Payer: No Typology Code available for payment source | Admitting: Professional

## 2021-10-30 ENCOUNTER — Encounter: Payer: Self-pay | Admitting: Professional

## 2021-10-30 DIAGNOSIS — F331 Major depressive disorder, recurrent, moderate: Secondary | ICD-10-CM | POA: Diagnosis not present

## 2021-10-30 DIAGNOSIS — F900 Attention-deficit hyperactivity disorder, predominantly inattentive type: Secondary | ICD-10-CM | POA: Diagnosis not present

## 2021-10-30 DIAGNOSIS — F411 Generalized anxiety disorder: Secondary | ICD-10-CM | POA: Diagnosis not present

## 2021-10-30 NOTE — Progress Notes (Signed)
° ° ° ° ° ° ° ° ° ° ° ° ° ° °  Adalberto Metzgar, LCMHC °

## 2021-10-30 NOTE — Progress Notes (Addendum)
[facility] Counselor/Therapist Progress Note  Patient ID: Kelly Haynes, MRN: [id number],    Date: 10/30/2021  Time Spent: 46 minutes 1-146 pm   Treatment Type: Individual Therapy  Reported Symptoms: anxious  Mental Status Exam: Appearance:  Casual and Neat     Behavior: Appropriate and Sharing  Motor: Normal  Speech/Language:  Clear and Coherent and Normal Rate  Affect: Full Range  Mood: normal  Thought process: goal directed  Thought content:   WNL  Sensory/Perceptual disturbances:   WNL  Orientation: oriented to person, place, time/date, and situation  Attention: Good  Concentration: Good  Memory: WNL  Fund of knowledge:  Good  Insight:   Good  Judgment:  Good  Impulse Control: Good   Risk Assessment: Danger to Self:  No Self-injurious Behavior: No Danger to Others: No Duty to Warn:no Physical Aggression / Violence:No  Access to Firearms a concern: No  Gang Involvement:No   Subjective: This session was held via video teletherapy due to the coronavirus risk at this time. The patient consented to video teletherapy and was located at her home during this session. She is aware it is the responsibility of the patient to secure confidentiality on her end of the session. The provider was in a private home office for the duration of this session.   The patient arrived on time for her webex appointment appearing nicely groomed and easily engaged.  Issues addressed: 1-  treatment planning -completed with patient -patient actively participated in development of the treatment plan and agrees with the plan.  Treatment Plan Problems Addressed  Anxiety, Grief / Loss Unresolved, Intimate Relationship Conflicts, Parenting  Goals 1. Achieve a greater level of family connectedness. 2. Achieve a level of competent, effective parenting. 3. Begin a healthy grieving process around the loss. 4. Complete the process of letting go of the lost significant other. 5.  Develop the necessary skills for effective, open communication, mutually satisfying sexual intimacy, and enjoyable time for companionship within the relationship. 6. Enhance ability to effectively cope with the full variety of life's worries and anxieties. 7. Increase awareness of own role in the relationship conflicts. 8. Learn and implement coping skills that result in a reduction of anxiety and worry, and improved daily functioning. 9. New Goal Statement for Anxiety Objective Describe situations, thoughts, feelings, and actions associated with anxieties and worries, their impact on functioning, and attempts to resolve them. Target Date: 2022-10-29 Frequency: Biweekly  Progress: 0 Modality: individual  Related Interventions Ask the client to describe his/her past experiences of anxiety and their impact on functioning; assess the focus, excessiveness, and uncontrollability of the worry and the type, frequency, intensity, and duration of his/her anxiety symptoms (consider using a structured interview such as The Anxiety Disorders Interview Schedule-Adult Version). Objective Verbalize an understanding of the cognitive, physiological, and behavioral components of anxiety and its treatment. Target Date: 2022-10-29 Frequency: Biweekly  Progress: 0 Modality: individual  Related Interventions Discuss how treatment targets worry, anxiety symptoms, and avoidance to help the client manage worry effectively, reduce overarousal, and eliminate unnecessary avoidance. Objective Learn and implement calming skills to reduce overall anxiety and manage anxiety symptoms. Target Date: 2022-10-29 Frequency: Biweekly  Progress: 0 Modality: individual  Related Interventions Teach the client calming/relaxation skills (e.g., applied relaxation, progressive muscle relaxation, cue controlled relaxation; mindful breathing; biofeedback) and how to discriminate better between relaxation and tension; teach the client how to  apply these skills to his/her daily life (e.g., New Directions in Progressive Muscle Relaxation by Casper Harrison,  and Hazlett-Stevens; Treating Generalized Anxiety Disorder by Rygh and Amparo Bristol). Assign the client homework each session in which he/she practices relaxation exercises daily, gradually applying them progressively from non-anxiety-provoking to anxiety-provoking situations; review and reinforce success while providing corrective feedback toward improvement. Objective Identify, challenge, and replace biased, fearful self-talk with positive, realistic, and empowering self-talk. Target Date: 2022-10-29 Frequency: Biweekly  Progress: 0 Modality: individual  Related Interventions Explore the client's schema and self-talk that mediate his/her fear response; assist him/her in challenging the biases; replace the distorted messages with reality-based alternatives and positive, realistic self-talk that will increase his/her self-confidence in coping with irrational fears (see Cognitive Therapy of Anxiety Disorders by Alison Stalling). Objective Learn and implement relapse prevention strategies for managing possible future anxiety symptoms. Target Date: 2022-10-29 Frequency: Biweekly  Progress: 0 Modality: individual  Objective Learn and implement problem-solving strategies for realistically addressing worries. Target Date: 2022-10-29 Frequency: Biweekly  Progress: 0 Modality: individual  Related Interventions Teach the client problem-solving strategies involving specifically defining a problem, generating options for addressing it, evaluating the pros and cons of each option, selecting and implementing an optional action, and reevaluating and refining the action (or assign "Applying Problem-Solving to Interpersonal Conflict" in the Adult Psychotherapy Homework Planner by Bryn Gulling). Assign the client a homework exercise in which he/she problem-solves a current problem (see Mastery of Your  Anxiety and Worry: Workbook by Adora Fridge and Eliot Ford or Generalized Anxiety Disorder by Eather Colas, and Eliot Ford); review, reinforce success, and provide corrective feedback toward improvement. Objective Undergo gradual repeated imaginal exposure to the feared negative consequences predicted by worries and develop alternative reality-based predictions. Target Date: 2022-10-29 Frequency: Biweekly  Progress: 0 Modality: individual  10. New Goal Statement for Grief / Loss Unresolved Objective Begin verbalizing feelings associated with the loss. Target Date: 2022-10-29 Frequency: Biweekly  Progress: 0 Modality: individual  Objective Tell in detail the story of the current loss that is triggering symptoms. Target Date: 2022-10-29 Frequency: Biweekly  Progress: 0 Modality: individual  Objective Verbalize resolution of feelings of guilt and regret associated with the loss. Target Date: 2022-10-29 Frequency: Biweekly  Progress: 0 Modality: individual  Objective Implement acts of spiritual faith as a source of comfort and hope. Target Date: 2022-10-29 Frequency: Biweekly  Progress: 0 Modality: individual  11. New Goal Statement for Intimate Relationship Conflicts Objective Identify problems and strengths in the relationship, including one's own role in each. Target Date: 2022-10-29 Frequency: Biweekly  Progress: 0 Modality: individual  Objective Learn and implement problem-solving and conflict resolution skills. Target Date: 2022-10-29 Frequency: Biweekly  Progress: 0 Modality: individual  Objective Increase flexibility of expectations, willingness to compromise, and acceptance of irreconcilable differences. Target Date: 2022-10-29 Frequency: Biweekly  Progress: 0 Modality: individual  Objective Understand the origin of each other's negative emotions and reactions and develop more constructive interactions that fill needs. Target Date: 2022-10-29 Frequency: Biweekly  Progress: 0 Modality:  individual  Objective Increase the frequency of the direct expression of honest, respectful, and positive feelings and thoughts within the relationship. Target Date: 2022-10-29 Frequency: Biweekly  Progress: 0 Modality: individual  12. Patient will have reasonable, healthy expectations regarding a balance between self-care and daughter's needs. Objective Learn and implement parenting practices that have demonstrated effectiveness. Target Date: 2022-10-29 Frequency: Biweekly  Progress: 0 Modality: individual  Objective Verbalize a sense of increased skill, effectiveness, and confidence in parenting. Target Date: 2022-10-29 Frequency: Biweekly  Progress: 0 Modality: individual  Objective Freely express feelings of frustration, helplessness, and inadequacy that each experiences in the  parenting role. Target Date: 2022-10-29 Frequency: Biweekly  Progress: 0 Modality: individual  Objective Identify unresolved childhood issues that affect parenting and work toward their resolution. Target Date: 2022-10-29 Frequency: Biweekly  Progress: 0 Modality: individual  Objective Decrease outside pressures, demands, and distractions that drain energy and time from the family. Target Date: 2022-10-29 Frequency: Biweekly  Progress: 0 Modality: individual  13. Reach a realistic view and approach to parenting, given the child's developmental level. 14. Reduce overall frequency, intensity, and duration of the anxiety so that daily functioning is not impaired. 15. Resolve the core conflict that is the source of anxiety. 16. Stabilize anxiety level while increasing ability to function on a daily basis.  Diagnosis:Generalized anxiety disorder  Major depressive disorder, recurrent episode, moderate (HCC)  Attention deficit hyperactivity disorder (ADHD), predominantly inattentive type  Plan:  -meet again on Tuesday, November 13, 2021 at [facility], [facility]

## 2021-11-01 ENCOUNTER — Other Ambulatory Visit: Payer: Self-pay

## 2021-11-01 ENCOUNTER — Ambulatory Visit: Payer: No Typology Code available for payment source | Admitting: Physical Therapy

## 2021-11-01 DIAGNOSIS — M542 Cervicalgia: Secondary | ICD-10-CM

## 2021-11-01 DIAGNOSIS — M546 Pain in thoracic spine: Secondary | ICD-10-CM

## 2021-11-01 DIAGNOSIS — M6281 Muscle weakness (generalized): Secondary | ICD-10-CM

## 2021-11-01 DIAGNOSIS — R293 Abnormal posture: Secondary | ICD-10-CM | POA: Diagnosis not present

## 2021-11-01 NOTE — Therapy (Signed)
[name], [place], [address] Phone: [phone]   Fax:  [phone]  Physical Therapy Treatment and Discharge  Patient Details  Name: Kelly Haynes MRN: [id number] Date of Birth: 09-06-1991 Referring Provider (PT): Terrilyn Saver, NP  PHYSICAL THERAPY DISCHARGE SUMMARY  Visits from Start of Care: 6  Current functional level related to goals / functional outcomes: See below   Remaining deficits: See below   Education / Equipment: See below   Patient agrees to discharge. Patient goals were met. Patient is being discharged due to meeting the stated rehab goals.   Encounter Date: 11/01/2021   PT End of Session - 11/01/21 0933     Visit Number 6    Number of Visits 6    Date for PT Re-Evaluation 10/25/21    Authorization Type [facility]    PT Start Time 6803    PT Stop Time 2122    PT Time Calculation (min) 42 min    Activity Tolerance Patient tolerated treatment well    Behavior During Therapy [facility] for tasks assessed/performed             Past Medical History:  Diagnosis Date   Abnormal Pap smear of cervix    Anxiety state 06/16/2015   Depression 06/16/2015   GERD (gastroesophageal reflux disease) 06/13/2015   Lactose intolerance 06/16/2015    Past Surgical History:  Procedure Laterality Date   COCCYX REMOVAL     TONSILLECTOMY  06/16/2015   WISDOM TOOTH EXTRACTION      There were no vitals filed for this visit.   Subjective Assessment - 11/01/21 0935     Subjective Pt states she's been doing her exercises. Pt reports she is stiff in the morning but after doing her stretches she always feels looser.    Limitations Lifting;House hold activities    How long can you sit comfortably? n/a    How long can you stand comfortably? n/a    How long can you walk comfortably? n/a    Patient Stated Goals Improve pain                [facility] PT Assessment - 11/01/21 0001        Assessment   Medical Diagnosis Back pain    Referring Provider (PT) Terrilyn Saver, NP    Next MD Visit PRN    Prior Therapy Knee pain      AROM   Cervical Flexion 65    Cervical Extension 50    Cervical - Right Side Bend 45    Cervical - Left Side Bend 48    Cervical - Right Rotation 75    Cervical - Left Rotation 75      Strength   Overall Strength Comments 4/5 mid/low trap bilat; 4/5 rhomboids bilat                           [facility] Adult PT Treatment/Exercise - 11/01/21 0001       Shoulder Exercises: Prone   Other Prone Exercises "W" x10 each    Other Prone Exercises "Y" 2x10      Shoulder Exercises: Standing   Horizontal ABduction Strengthening;Both;20 reps;Theraband    Theraband Level (Shoulder Horizontal ABduction) Level 2 (Red)    Other Standing Exercises Low trap setting with red tband 2x10      Shoulder Exercises: ROM/Strengthening   Other ROM/Strengthening Exercises Pulley flexion x1 min, abd  x 1 min      Manual Therapy   Manual therapy comments Skilled palpation and assessment for TPDN and manual work    Soft tissue mobilization STM upper trap              Trigger Point Dry Needling - 11/01/21 0001     Consent Given? Yes    Education Handout Provided Previously provided    Muscles Treated Head and Neck Upper trapezius;Levator scapulae    Upper Trapezius Response Twitch reponse elicited;Palpable increased muscle length    Levator Scapulae Response Twitch response elicited;Palpable increased muscle length                   PT Education - 11/01/21 1311     Education Details Discussed how to self progress her exercises at home. Reviewed body mechanics with daily tasks.    Person(s) Educated Patient    Methods Explanation;Demonstration;Tactile cues;Verbal cues;Handout    Comprehension Verbalized understanding;Returned demonstration;Verbal cues required;Tactile cues required                 PT Long Term Goals -  11/01/21 0950       PT LONG TERM GOAL #1   Title Pt will be independent with HEP    Time 6    Period Weeks    Status Achieved    Target Date 10/25/21      PT LONG TERM GOAL #2   Title Pt will improve cervical AROM to [facility]    Time 6    Period Weeks    Status Achieved    Target Date 10/25/21      PT LONG TERM GOAL #3   Title Pt will demo improved mid back strength to at least 4/5    Time 6    Period Weeks    Status Achieved    Target Date 10/25/21      PT LONG TERM GOAL #4   Title Pt report decrease in pain by at least 50%    Baseline Decreased overall frequency and intensity; will still get some stiffness in the morning    Time 6    Period Weeks    Status Achieved    Target Date 10/25/21                   Plan - 11/01/21 1307     Clinical Impression Statement At this point, Latonja has done well with therapy and feels ready for PT d/c. She demos improved mid and low back muscles and verbalizes understanding of body mechanics and how to self manage her symptoms at home. She has met all of her LTGs.    Personal Factors and Comorbidities Age;Fitness;Time since onset of injury/illness/exacerbation    Examination-Activity Limitations Reach Overhead;Carry;Lift    Examination-Participation Restrictions Cleaning;Occupation;Community Activity    Stability/Clinical Decision Making Stable/Uncomplicated    Rehab Potential Good    PT Frequency 1x / week    PT Duration 6 weeks    PT Treatment/Interventions ADLs/Self Care Home Management;Cryotherapy;Electrical Stimulation;Iontophoresis 41m/ml Dexamethasone;Moist Heat;Ultrasound;DME Instruction;Functional mobility training;Therapeutic activities;Therapeutic exercise;Balance training;Neuromuscular re-education;Manual techniques;Patient/family education;Passive range of motion;Taping;Spinal Manipulations;Joint Manipulations    PT Next Visit Plan Work on posterior shoulder girdle strengthening and neck. Continue manual work as  needed. Go over body mechanics at work.    PT Home Exercise Plan Access Code [email or web address]   Consulted and Agree with Plan of Care Patient             Patient will benefit  from skilled therapeutic intervention in order to improve the following deficits and impairments:  Decreased range of motion, Increased fascial restricitons, Impaired UE functional use, Pain, Decreased activity tolerance, Impaired flexibility, Decreased mobility, Decreased strength, Postural dysfunction, Improper body mechanics  Visit Diagnosis: Abnormal posture  Pain in thoracic spine  Cervicalgia  Muscle weakness (generalized)     Problem List Patient Active Problem List   Diagnosis Date Noted   Generalized anxiety disorder 10/18/2021   Major depressive disorder, recurrent episode, mild ([place]) 10/18/2021   Major depressive disorder, recurrent episode, moderate ([place]) 10/18/2021   History of loop electrical excision procedure (LEEP) 06/22/2020   History of abnormal cervical Pap smear 02/28/2017   Attention deficit hyperactivity disorder (ADHD), predominantly inattentive type 02/28/2017   S/P tonsillectomy 06/16/2015   Anxiety 06/16/2015   Depression with anxiety 06/16/2015   Lactose intolerance 06/16/2015   Gastric polyp 06/16/2015   IUD (intrauterine device) in place 06/16/2015   Gastro-esophageal reflux disease without esophagitis 06/13/2015   Irritable bowel syndrome 01/15/2012    [facility] April Gordy Levan, PT, DPT 11/01/2021, 1:14 PM  [facility] [place], [place], [address] Phone: [phone]   Fax:  [phone]  Name: Samai Corea MRN: [id number] Date of Birth: 1990/12/29

## 2021-11-11 ENCOUNTER — Other Ambulatory Visit (HOSPITAL_COMMUNITY): Payer: Self-pay

## 2021-11-13 ENCOUNTER — Other Ambulatory Visit (HOSPITAL_COMMUNITY): Payer: Self-pay

## 2021-11-13 ENCOUNTER — Ambulatory Visit (INDEPENDENT_AMBULATORY_CARE_PROVIDER_SITE_OTHER): Payer: No Typology Code available for payment source | Admitting: Professional

## 2021-11-13 ENCOUNTER — Encounter: Payer: Self-pay | Admitting: Professional

## 2021-11-13 DIAGNOSIS — F411 Generalized anxiety disorder: Secondary | ICD-10-CM

## 2021-11-13 DIAGNOSIS — F331 Major depressive disorder, recurrent, moderate: Secondary | ICD-10-CM

## 2021-11-13 DIAGNOSIS — F900 Attention-deficit hyperactivity disorder, predominantly inattentive type: Secondary | ICD-10-CM

## 2021-11-13 MED ORDER — FLUOXETINE HCL 20 MG PO CAPS
40.0000 mg | ORAL_CAPSULE | Freq: Every morning | ORAL | 2 refills | Status: DC
  Filled 2021-11-13: qty 60, 30d supply, fill #0
  Filled 2021-12-12: qty 60, 30d supply, fill #1
  Filled 2022-01-09: qty 60, 30d supply, fill #2

## 2021-11-13 MED ORDER — AMPHETAMINE-DEXTROAMPHET ER 20 MG PO CP24
20.0000 mg | ORAL_CAPSULE | Freq: Two times a day (BID) | ORAL | 0 refills | Status: DC
  Filled 2021-11-13: qty 60, 30d supply, fill #0

## 2021-11-13 NOTE — Progress Notes (Signed)
[facility] Counselor/Therapist Progress Note  Patient ID: Kelly Haynes, MRN: [id number],    Date: 11/13/2021  Time Spent: 43 minutes 07-1142 am   Treatment Type: Individual Therapy  Reported Symptoms: mild anxiety  Mental Status Exam: Appearance:  Casual and Neat     Behavior: Appropriate and Sharing  Motor: Normal  Speech/Language:  Clear and Coherent and Normal Rate  Affect: Full Range  Mood: normal  Thought process: goal directed  Thought content:   WNL  Sensory/Perceptual disturbances:   WNL  Orientation: oriented to person, place, time/date, and situation  Attention: Good  Concentration: Good  Memory: WNL  Fund of knowledge:  Good  Insight:   Good  Judgment:  Good  Impulse Control: Good   Risk Assessment: Danger to Self:  No Self-injurious Behavior: No Danger to Others: No Duty to Warn:no Physical Aggression / Violence:No  Access to Firearms a concern: No  Gang Involvement:No   Subjective: This session was held via video teletherapy due to the coronavirus risk at this time. The patient consented to video teletherapy and was located at her home during this session. She is aware it is the responsibility of the patient to secure confidentiality on her end of the session. The provider was in a private home office for the duration of this session.   The patient arrived on time for her webex appointment appearing nicely groomed and easily engaged.  Issues addressed: 1- anxiety -pt learned that she spends a lot of time thinking about the future -thinking about whether the man she is dating is who she wants in her life 2-relationship -he treats her really well -financially he is stable -she could have another child and not have to work -she doesn't have a huge desire to get married and be with him -she loves having out with him and they have a lot of similar interests -she started dating him when [facility] was [age] and is the first person she  dated -sometimes he doesn't understand the time that she needs to give to [facility]   -he had bad parents and was abused as a child   -he sees her as giving her everything   -he loves her and want the best for her but [facility] will be before him in her decisions -does not see Audelia Acton as important to her as Emory -she told him several weeks ago that she is trying to decide 3-values -what she wants in a relationship -awareness of characteristics she desires in a partner  Treatment Plan Problems Addressed  Anxiety, Grief / Loss Unresolved, Intimate Relationship Conflicts, Parenting  Goals 1. Achieve a greater level of family connectedness. 2. Achieve a level of competent, effective parenting. 3. Begin a healthy grieving process around the loss. 4. Complete the process of letting go of the lost significant other. 5. Develop the necessary skills for effective, open communication, mutually satisfying sexual intimacy, and enjoyable time for companionship within the relationship. 6. Enhance ability to effectively cope with the full variety of life's worries and anxieties. 7. Increase awareness of own role in the relationship conflicts. 8. Learn and implement coping skills that result in a reduction of anxiety and worry, and improved daily functioning. 9. New Goal Statement for Anxiety Objective Describe situations, thoughts, feelings, and actions associated with anxieties and worries, their impact on functioning, and attempts to resolve them. Target Date: 2022-10-29 Frequency: Biweekly  Progress: 0 Modality: individual  Related Interventions Ask the client to describe his/her past experiences of anxiety and their  impact on functioning; assess the focus, excessiveness, and uncontrollability of the worry and the type, frequency, intensity, and duration of his/her anxiety symptoms (consider using a structured interview such as The Anxiety Disorders Interview Schedule-Adult Version). Objective Verbalize an  understanding of the cognitive, physiological, and behavioral components of anxiety and its treatment. Target Date: 2022-10-29 Frequency: Biweekly  Progress: 0 Modality: individual  Related Interventions Discuss how treatment targets worry, anxiety symptoms, and avoidance to help the client manage worry effectively, reduce overarousal, and eliminate unnecessary avoidance. Objective Learn and implement calming skills to reduce overall anxiety and manage anxiety symptoms. Target Date: 2022-10-29 Frequency: Biweekly  Progress: 0 Modality: individual  Related Interventions Teach the client calming/relaxation skills (e.g., applied relaxation, progressive muscle relaxation, cue controlled relaxation; mindful breathing; biofeedback) and how to discriminate better between relaxation and tension; teach the client how to apply these skills to his/her daily life (e.g., New Directions in Progressive Muscle Relaxation by Casper Harrison, and Hazlett-Stevens; Treating Generalized Anxiety Disorder by Rygh and Amparo Bristol). Assign the client homework each session in which he/she practices relaxation exercises daily, gradually applying them progressively from non-anxiety-provoking to anxiety-provoking situations; review and reinforce success while providing corrective feedback toward improvement. Objective Identify, challenge, and replace biased, fearful self-talk with positive, realistic, and empowering self-talk. Target Date: 2022-10-29 Frequency: Biweekly  Progress: 0 Modality: individual  Related Interventions Explore the client's schema and self-talk that mediate his/her fear response; assist him/her in challenging the biases; replace the distorted messages with reality-based alternatives and positive, realistic self-talk that will increase his/her self-confidence in coping with irrational fears (see Cognitive Therapy of Anxiety Disorders by Alison Stalling). Objective Learn and implement relapse prevention  strategies for managing possible future anxiety symptoms. Target Date: 2022-10-29 Frequency: Biweekly  Progress: 0 Modality: individual  Objective Learn and implement problem-solving strategies for realistically addressing worries. Target Date: 2022-10-29 Frequency: Biweekly  Progress: 0 Modality: individual  Related Interventions Teach the client problem-solving strategies involving specifically defining a problem, generating options for addressing it, evaluating the pros and cons of each option, selecting and implementing an optional action, and reevaluating and refining the action (or assign "Applying Problem-Solving to Interpersonal Conflict" in the Adult Psychotherapy Homework Planner by Bryn Gulling). Assign the client a homework exercise in which he/she problem-solves a current problem (see Mastery of Your Anxiety and Worry: Workbook by Adora Fridge and Eliot Ford or Generalized Anxiety Disorder by Eather Colas, and Eliot Ford); review, reinforce success, and provide corrective feedback toward improvement. Objective Undergo gradual repeated imaginal exposure to the feared negative consequences predicted by worries and develop alternative reality-based predictions. Target Date: 2022-10-29 Frequency: Biweekly  Progress: 0 Modality: individual  10. New Goal Statement for Grief / Loss Unresolved Objective Begin verbalizing feelings associated with the loss. Target Date: 2022-10-29 Frequency: Biweekly  Progress: 0 Modality: individual  Objective Tell in detail the story of the current loss that is triggering symptoms. Target Date: 2022-10-29 Frequency: Biweekly  Progress: 0 Modality: individual  Objective Verbalize resolution of feelings of guilt and regret associated with the loss. Target Date: 2022-10-29 Frequency: Biweekly  Progress: 0 Modality: individual  Objective Implement acts of spiritual faith as a source of comfort and hope. Target Date: 2022-10-29 Frequency: Biweekly  Progress: 0 Modality:  individual  11. New Goal Statement for Intimate Relationship Conflicts Objective Identify problems and strengths in the relationship, including one's own role in each. Target Date: 2022-10-29 Frequency: Biweekly  Progress: 0 Modality: individual  Objective Learn and implement problem-solving and conflict resolution skills. Target Date: 2022-10-29 Frequency: Biweekly  Progress: 0 Modality: individual  Objective Increase flexibility of expectations, willingness to compromise, and acceptance of irreconcilable differences. Target Date: 2022-10-29 Frequency: Biweekly  Progress: 0 Modality: individual  Objective Understand the origin of each other's negative emotions and reactions and develop more constructive interactions that fill needs. Target Date: 2022-10-29 Frequency: Biweekly  Progress: 0 Modality: individual  Objective Increase the frequency of the direct expression of honest, respectful, and positive feelings and thoughts within the relationship. Target Date: 2022-10-29 Frequency: Biweekly  Progress: 0 Modality: individual  12. Patient will have reasonable, healthy expectations regarding a balance between self-care and daughter's needs. Objective Learn and implement parenting practices that have demonstrated effectiveness. Target Date: 2022-10-29 Frequency: Biweekly  Progress: 0 Modality: individual  Objective Verbalize a sense of increased skill, effectiveness, and confidence in parenting. Target Date: 2022-10-29 Frequency: Biweekly  Progress: 0 Modality: individual  Objective Freely express feelings of frustration, helplessness, and inadequacy that each experiences in the parenting role. Target Date: 2022-10-29 Frequency: Biweekly  Progress: 0 Modality: individual  Objective Identify unresolved childhood issues that affect parenting and work toward their resolution. Target Date: 2022-10-29 Frequency: Biweekly  Progress: 0 Modality: individual  Objective Decrease outside  pressures, demands, and distractions that drain energy and time from the family. Target Date: 2022-10-29 Frequency: Biweekly  Progress: 0 Modality: individual  13. Reach a realistic view and approach to parenting, given the child's developmental level. 14. Reduce overall frequency, intensity, and duration of the anxiety so that daily functioning is not impaired. 15. Resolve the core conflict that is the source of anxiety. 16. Stabilize anxiety level while increasing ability to function on a daily basis.  Diagnosis:Generalized anxiety disorder  Major depressive disorder, recurrent episode, moderate (HCC)  Attention deficit hyperactivity disorder (ADHD), predominantly inattentive type  Plan:  -complete values self-exploration exercise -meet again on Thursday, November 29, 2021 at [place], [place], [facility]

## 2021-11-15 ENCOUNTER — Ambulatory Visit: Payer: No Typology Code available for payment source | Admitting: Professional

## 2021-11-21 ENCOUNTER — Other Ambulatory Visit (HOSPITAL_COMMUNITY): Payer: Self-pay

## 2021-11-21 ENCOUNTER — Encounter: Payer: Self-pay | Admitting: Medical-Surgical

## 2021-11-21 MED ORDER — FLUOXETINE HCL 20 MG PO CAPS: 40.0000 mg | ORAL_CAPSULE | Freq: Every morning | ORAL | 2 refills | Status: DC

## 2021-11-21 MED ORDER — AMPHETAMINE-DEXTROAMPHET ER 20 MG PO CP24: 20.0000 mg | ORAL_CAPSULE | Freq: Two times a day (BID) | ORAL | 0 refills | Status: DC

## 2021-11-27 ENCOUNTER — Ambulatory Visit (INDEPENDENT_AMBULATORY_CARE_PROVIDER_SITE_OTHER): Payer: No Typology Code available for payment source | Admitting: Medical-Surgical

## 2021-11-27 ENCOUNTER — Encounter: Payer: Self-pay | Admitting: Medical-Surgical

## 2021-11-27 ENCOUNTER — Other Ambulatory Visit: Payer: Self-pay

## 2021-11-27 VITALS — BP 112/75 | HR 75 | Resp 20 | Ht 66.0 in | Wt 145.6 lb

## 2021-11-27 DIAGNOSIS — R5383 Other fatigue: Secondary | ICD-10-CM

## 2021-11-27 DIAGNOSIS — M546 Pain in thoracic spine: Secondary | ICD-10-CM

## 2021-11-27 DIAGNOSIS — G8929 Other chronic pain: Secondary | ICD-10-CM

## 2021-11-27 DIAGNOSIS — M542 Cervicalgia: Secondary | ICD-10-CM | POA: Diagnosis not present

## 2021-11-27 NOTE — Progress Notes (Signed)
?  HPI with pertinent ROS:  ? ?CC: fatigue, back pain ? ?HPI: ?Pleasant 31 year old female presenting today for the following: ? ?Fatigue: Has had issues with profound fatigue since she can remember, most noticeable since high school.  Has episodes where she feels like she could fall asleep and stay asleep anytime.  This has been worked up with labs before but never been further investigated.  She is interested at this point getting further investigation.  Would like to do a sleep study as well as any other testing that may point to a cause for her chronic fatigue. ? ?Back pain: Was seen in December for cervical and upper back pain that radiated to the right shoulder.  Initial conservative management was completed with anti-inflammatories as well as 6 weeks of physical therapy.  She has had no improvement in her symptoms overall.  She does note that the stretches and exercises she was given at physical therapy helps temporarily throughout the day however she does wake in the morning with significant neck and right shoulder pain on a daily basis.  Ibuprofen 600 mg as needed does help some with the discomfort but this is also temporary. ? ?I reviewed the past medical history, family history, social history, surgical history, and allergies today and no changes were needed.  Please see the problem list section below in epic for further details. ? ?Physical exam:  ? ?General: Well Developed, well nourished, and in no acute distress.  ?Neuro: Alert and oriented x3.  ?HEENT: Normocephalic, atraumatic.  ?Skin: Warm and dry. ?Cardiac: Regular rate and rhythm, no murmurs rubs or gallops, no lower extremity edema.  ?Respiratory: Clear to auscultation bilaterally. Not using accessory muscles, speaking in full sentences. ? ?Impression and Recommendations:   ? ?1. Fatigue, unspecified type ?Unclear etiology.  This has been worked up several times over the years with laboratory testing which has been unremarkable.  Consider chronic  fatigue syndrome, psychosomatic effects from anxiety/depression, etc.  Limited evidence to prompt evaluation for systemic disease such as Lyme disease, HIV, tuberculosis, etc.  Exam unrevealing outside of musculoskeletal complaints as documented below.  We will proceed with a sleep study.  Plan to evaluate prior evaluation to see what has been done to guide further investigation. ? ?2. Chronic right-sided thoracic back pain ?3. Cervicalgia ?Cervical spine x-rays today.  She has completed and failed at least 6 weeks of conservative treatment with anti-inflammatories and physical therapy.  Discussed switching pillows, massage, acupuncture, acupressure, and continuing anti-inflammatories.  She would like to pursue advanced imaging so once x-rays are back, we will see about getting an MRI. ?- DG Cervical Spine Complete; Future ? ? ?No follow-ups on file. ?___________________________________________ ?Clearnce Sorrel, DNP, APRN, FNP-BC ?Primary Care and Sports Medicine ?[place] ?

## 2021-11-28 ENCOUNTER — Encounter: Payer: Self-pay | Admitting: Medical-Surgical

## 2021-11-29 ENCOUNTER — Other Ambulatory Visit: Payer: Self-pay

## 2021-11-29 ENCOUNTER — Encounter: Payer: Self-pay | Admitting: Professional

## 2021-11-29 ENCOUNTER — Ambulatory Visit (INDEPENDENT_AMBULATORY_CARE_PROVIDER_SITE_OTHER): Payer: No Typology Code available for payment source

## 2021-11-29 ENCOUNTER — Ambulatory Visit (INDEPENDENT_AMBULATORY_CARE_PROVIDER_SITE_OTHER): Payer: No Typology Code available for payment source | Admitting: Professional

## 2021-11-29 DIAGNOSIS — F900 Attention-deficit hyperactivity disorder, predominantly inattentive type: Secondary | ICD-10-CM

## 2021-11-29 DIAGNOSIS — F331 Major depressive disorder, recurrent, moderate: Secondary | ICD-10-CM | POA: Diagnosis not present

## 2021-11-29 DIAGNOSIS — F411 Generalized anxiety disorder: Secondary | ICD-10-CM

## 2021-11-29 DIAGNOSIS — M542 Cervicalgia: Secondary | ICD-10-CM

## 2021-11-29 DIAGNOSIS — G8929 Other chronic pain: Secondary | ICD-10-CM

## 2021-11-29 NOTE — Progress Notes (Incomplete)
[place] Counselor/Therapist Progress Note  Patient ID: Kelly Haynes, MRN: [id number],    Date: 11/29/2021  Time Spent: 43 minutes 07-1142 am   Treatment Type: Individual Therapy  Reported Symptoms: mild anxiety  Mental Status Exam: Appearance:  Casual and Neat     Behavior: Appropriate and Sharing  Motor: Normal  Speech/Language:  Clear and Coherent and Normal Rate  Affect: Full Range  Mood: normal  Thought process: goal directed  Thought content:   WNL  Sensory/Perceptual disturbances:   WNL  Orientation: oriented to person, place, time/date, and situation  Attention: Good  Concentration: Good  Memory: WNL  Fund of knowledge:  Good  Insight:   Good  Judgment:  Good  Impulse Control: Good   Risk Assessment: Danger to Self:  No Self-injurious Behavior: No Danger to Others: No Duty to Warn:no Physical Aggression / Violence:No  Access to Firearms a concern: No  Gang Involvement:No   Subjective: This session was held via video teletherapy due to the coronavirus risk at this time. The patient consented to video teletherapy and was located at her home during this session. She is aware it is the responsibility of the patient to secure confidentiality on her end of the session. The provider was in a private home office for the duration of this session.   The patient arrived on time for her webex appointment appearing nicely groomed and easily engaged.  Issues addressed: 1- PHQ-9/GAD-7 09/08/2021 17 GAD PHQ-9     13 11/29/2021       5  9 2-anxiety -pt learned that she spends a lot of time thinking about the future -thinking about whether the man she is dating is who she wants in her life 2-relationship -Emory's father is drinking again -she will not permit her daughter to go to his home -his girlfriend break up with him due to his drinking -he gets himself in trouble but pays his way out 4-values -needs to complete  Treatment Plan Problems Addressed   Anxiety, Grief / Loss Unresolved, Intimate Relationship Conflicts, Parenting  Goals 1. Achieve a greater level of family connectedness. 2. Achieve a level of competent, effective parenting. 3. Begin a healthy grieving process around the loss. 4. Complete the process of letting go of the lost significant other. 5. Develop the necessary skills for effective, open communication, mutually satisfying sexual intimacy, and enjoyable time for companionship within the relationship. 6. Enhance ability to effectively cope with the full variety of life's worries and anxieties. 7. Increase awareness of own role in the relationship conflicts. 8. Learn and implement coping skills that result in a reduction of anxiety and worry, and improved daily functioning. 9. New Goal Statement for Anxiety Objective Describe situations, thoughts, feelings, and actions associated with anxieties and worries, their impact on functioning, and attempts to resolve them. Target Date: 2022-10-29 Frequency: Biweekly  Progress: 0 Modality: individual  Related Interventions Ask the client to describe his/her past experiences of anxiety and their impact on functioning; assess the focus, excessiveness, and uncontrollability of the worry and the type, frequency, intensity, and duration of his/her anxiety symptoms (consider using a structured interview such as The Anxiety Disorders Interview Schedule-Adult Version). Objective Verbalize an understanding of the cognitive, physiological, and behavioral components of anxiety and its treatment. Target Date: 2022-10-29 Frequency: Biweekly  Progress: 0 Modality: individual  Related Interventions Discuss how treatment targets worry, anxiety symptoms, and avoidance to help the client manage worry effectively, reduce overarousal, and eliminate unnecessary avoidance. Objective Learn and implement calming  skills to reduce overall anxiety and manage anxiety symptoms. Target Date: 2022-10-29  Frequency: Biweekly  Progress: 0 Modality: individual  Related Interventions Teach the client calming/relaxation skills (e.g., applied relaxation, progressive muscle relaxation, cue controlled relaxation; mindful breathing; biofeedback) and how to discriminate better between relaxation and tension; teach the client how to apply these skills to his/her daily life (e.g., New Directions in Progressive Muscle Relaxation by Casper Harrison, and Hazlett-Stevens; Treating Generalized Anxiety Disorder by Rygh and Amparo Bristol). Assign the client homework each session in which he/she practices relaxation exercises daily, gradually applying them progressively from non-anxiety-provoking to anxiety-provoking situations; review and reinforce success while providing corrective feedback toward improvement. Objective Identify, challenge, and replace biased, fearful self-talk with positive, realistic, and empowering self-talk. Target Date: 2022-10-29 Frequency: Biweekly  Progress: 0 Modality: individual  Related Interventions Explore the client's schema and self-talk that mediate his/her fear response; assist him/her in challenging the biases; replace the distorted messages with reality-based alternatives and positive, realistic self-talk that will increase his/her self-confidence in coping with irrational fears (see Cognitive Therapy of Anxiety Disorders by Alison Stalling). Objective Learn and implement relapse prevention strategies for managing possible future anxiety symptoms. Target Date: 2022-10-29 Frequency: Biweekly  Progress: 0 Modality: individual  Objective Learn and implement problem-solving strategies for realistically addressing worries. Target Date: 2022-10-29 Frequency: Biweekly  Progress: 0 Modality: individual  Related Interventions Teach the client problem-solving strategies involving specifically defining a problem, generating options for addressing it, evaluating the pros and cons of each  option, selecting and implementing an optional action, and reevaluating and refining the action (or assign "Applying Problem-Solving to Interpersonal Conflict" in the Adult Psychotherapy Homework Planner by Bryn Gulling). Assign the client a homework exercise in which he/she problem-solves a current problem (see Mastery of Your Anxiety and Worry: Workbook by Adora Fridge and Eliot Ford or Generalized Anxiety Disorder by Eather Colas, and Eliot Ford); review, reinforce success, and provide corrective feedback toward improvement. Objective Undergo gradual repeated imaginal exposure to the feared negative consequences predicted by worries and develop alternative reality-based predictions. Target Date: 2022-10-29 Frequency: Biweekly  Progress: 0 Modality: individual  10. New Goal Statement for Grief / Loss Unresolved Objective Begin verbalizing feelings associated with the loss. Target Date: 2022-10-29 Frequency: Biweekly  Progress: 0 Modality: individual  Objective Tell in detail the story of the current loss that is triggering symptoms. Target Date: 2022-10-29 Frequency: Biweekly  Progress: 0 Modality: individual  Objective Verbalize resolution of feelings of guilt and regret associated with the loss. Target Date: 2022-10-29 Frequency: Biweekly  Progress: 0 Modality: individual  Objective Implement acts of spiritual faith as a source of comfort and hope. Target Date: 2022-10-29 Frequency: Biweekly  Progress: 0 Modality: individual  11. New Goal Statement for Intimate Relationship Conflicts Objective Identify problems and strengths in the relationship, including one's own role in each. Target Date: 2022-10-29 Frequency: Biweekly  Progress: 0 Modality: individual  Objective Learn and implement problem-solving and conflict resolution skills. Target Date: 2022-10-29 Frequency: Biweekly  Progress: 0 Modality: individual  Objective Increase flexibility of expectations, willingness to compromise, and  acceptance of irreconcilable differences. Target Date: 2022-10-29 Frequency: Biweekly  Progress: 0 Modality: individual  Objective Understand the origin of each other's negative emotions and reactions and develop more constructive interactions that fill needs. Target Date: 2022-10-29 Frequency: Biweekly  Progress: 0 Modality: individual  Objective Increase the frequency of the direct expression of honest, respectful, and positive feelings and thoughts within the relationship. Target Date: 2022-10-29 Frequency: Biweekly  Progress: 0 Modality: individual  12. Patient  will have reasonable, healthy expectations regarding a balance between self-care and daughter's needs. Objective Learn and implement parenting practices that have demonstrated effectiveness. Target Date: 2022-10-29 Frequency: Biweekly  Progress: 0 Modality: individual  Objective Verbalize a sense of increased skill, effectiveness, and confidence in parenting. Target Date: 2022-10-29 Frequency: Biweekly  Progress: 0 Modality: individual  Objective Freely express feelings of frustration, helplessness, and inadequacy that each experiences in the parenting role. Target Date: 2022-10-29 Frequency: Biweekly  Progress: 0 Modality: individual  Objective Identify unresolved childhood issues that affect parenting and work toward their resolution. Target Date: 2022-10-29 Frequency: Biweekly  Progress: 0 Modality: individual  Objective Decrease outside pressures, demands, and distractions that drain energy and time from the family. Target Date: 2022-10-29 Frequency: Biweekly  Progress: 0 Modality: individual  13. Reach a realistic view and approach to parenting, given the child's developmental level. 14. Reduce overall frequency, intensity, and duration of the anxiety so that daily functioning is not impaired. 15. Resolve the core conflict that is the source of anxiety. 16. Stabilize anxiety level while increasing ability to  function on a daily basis.  Diagnosis:Generalized anxiety disorder  Major depressive disorder, recurrent episode, moderate (HCC)  Attention deficit hyperactivity disorder (ADHD), predominantly inattentive type  Plan:  -complete values self-exploration exercise -meet again on Thursday, November 29, 2021 at [place], [place], [facility]

## 2021-11-29 NOTE — Progress Notes (Signed)
[facility] Counselor/Therapist Progress Note  Patient ID: Kelly Haynes, MRN: [id number],    Date: 11/30/2021  Time Spent: 59 minutes [id number] pm   Treatment Type: Individual Therapy  Reported Symptoms: mild anxiety  Mental Status Exam: Appearance:  Casual and Neat     Behavior: Appropriate and Sharing  Motor: Normal  Speech/Language:  Clear and Coherent and Normal Rate  Affect: Full Range  Mood: normal  Thought process: goal directed  Thought content:   WNL  Sensory/Perceptual disturbances:   WNL  Orientation: oriented to person, place, time/date, and situation  Attention: Good  Concentration: Good  Memory: WNL  Fund of knowledge:  Good  Insight:   Good  Judgment:  Good  Impulse Control: Good   Risk Assessment: Danger to Self:  No Self-injurious Behavior: No Danger to Others: No Duty to Warn:no Physical Aggression / Violence:No  Access to Firearms a concern: No  Gang Involvement:No   Subjective: This session was held via video teletherapy due to the coronavirus risk at this time. The patient consented to video teletherapy and was located at her home during this session. She is aware it is the responsibility of the patient to secure confidentiality on her end of the session. The provider was in a private home office for the duration of this session.   The patient arrived on time for her webex appointment appearing nicely groomed and easily engaged.  Issues addressed: 1- relationship with Audelia Acton -pt ended her relationship with boyfriend Audelia Acton -pt feels so much better since she is again single -pt knows that he was not the right person for her -Audelia Acton did not understand but she has remained constant on her decision   -pt has ended the relationship before and chosen to return for him 2-relationship with Emory's father -he has resumed drinking and she will not permit her to spend the night at his home -his girlfriend has ended the relationship and he  is again single   -pt allowed Emory to spent the night when the gf was there because she trusted her -pt admits that getting over the loss of the relationship she had with Emory's father was difficult 3-self-care -putting self first 4- PHQ-9/GAD-7 09/08/2021 17 GAD       PHQ-9     13 11/29/2021       5                 9  Treatment Plan Problems Addressed  Anxiety, Grief / Loss Unresolved, Intimate Relationship Conflicts, Parenting  Goals 1. Achieve a greater level of family connectedness. 2. Achieve a level of competent, effective parenting. 3. Begin a healthy grieving process around the loss. 4. Complete the process of letting go of the lost significant other. 5. Develop the necessary skills for effective, open communication, mutually satisfying sexual intimacy, and enjoyable time for companionship within the relationship. 6. Enhance ability to effectively cope with the full variety of life's worries and anxieties. 7. Increase awareness of own role in the relationship conflicts. 8. Learn and implement coping skills that result in a reduction of anxiety and worry, and improved daily functioning. 9. New Goal Statement for Anxiety Objective Describe situations, thoughts, feelings, and actions associated with anxieties and worries, their impact on functioning, and attempts to resolve them. Target Date: 2022-10-29 Frequency: Biweekly  Progress: 0 Modality: individual  Related Interventions Ask the client to describe his/her past experiences of anxiety and their impact on functioning; assess the focus, excessiveness, and uncontrollability of the  worry and the type, frequency, intensity, and duration of his/her anxiety symptoms (consider using a structured interview such as The Anxiety Disorders Interview Schedule-Adult Version). Objective Verbalize an understanding of the cognitive, physiological, and behavioral components of anxiety and its treatment. Target Date: 2022-10-29 Frequency:  Biweekly  Progress: 0 Modality: individual  Related Interventions Discuss how treatment targets worry, anxiety symptoms, and avoidance to help the client manage worry effectively, reduce overarousal, and eliminate unnecessary avoidance. Objective Learn and implement calming skills to reduce overall anxiety and manage anxiety symptoms. Target Date: 2022-10-29 Frequency: Biweekly  Progress: 0 Modality: individual  Related Interventions Teach the client calming/relaxation skills (e.g., applied relaxation, progressive muscle relaxation, cue controlled relaxation; mindful breathing; biofeedback) and how to discriminate better between relaxation and tension; teach the client how to apply these skills to his/her daily life (e.g., New Directions in Progressive Muscle Relaxation by Casper Harrison, and Hazlett-Stevens; Treating Generalized Anxiety Disorder by Rygh and Amparo Bristol). Assign the client homework each session in which he/she practices relaxation exercises daily, gradually applying them progressively from non-anxiety-provoking to anxiety-provoking situations; review and reinforce success while providing corrective feedback toward improvement. Objective Identify, challenge, and replace biased, fearful self-talk with positive, realistic, and empowering self-talk. Target Date: 2022-10-29 Frequency: Biweekly  Progress: 0 Modality: individual  Related Interventions Explore the client's schema and self-talk that mediate his/her fear response; assist him/her in challenging the biases; replace the distorted messages with reality-based alternatives and positive, realistic self-talk that will increase his/her self-confidence in coping with irrational fears (see Cognitive Therapy of Anxiety Disorders by Alison Stalling). Objective Learn and implement relapse prevention strategies for managing possible future anxiety symptoms. Target Date: 2022-10-29 Frequency: Biweekly  Progress: 0 Modality: individual   Objective Learn and implement problem-solving strategies for realistically addressing worries. Target Date: 2022-10-29 Frequency: Biweekly  Progress: 0 Modality: individual  Related Interventions Teach the client problem-solving strategies involving specifically defining a problem, generating options for addressing it, evaluating the pros and cons of each option, selecting and implementing an optional action, and reevaluating and refining the action (or assign "Applying Problem-Solving to Interpersonal Conflict" in the Adult Psychotherapy Homework Planner by Bryn Gulling). Assign the client a homework exercise in which he/she problem-solves a current problem (see Mastery of Your Anxiety and Worry: Workbook by Adora Fridge and Eliot Ford or Generalized Anxiety Disorder by Eather Colas, and Eliot Ford); review, reinforce success, and provide corrective feedback toward improvement. Objective Undergo gradual repeated imaginal exposure to the feared negative consequences predicted by worries and develop alternative reality-based predictions. Target Date: 2022-10-29 Frequency: Biweekly  Progress: 0 Modality: individual  10. New Goal Statement for Grief / Loss Unresolved Objective Begin verbalizing feelings associated with the loss. Target Date: 2022-10-29 Frequency: Biweekly  Progress: 0 Modality: individual  Objective Tell in detail the story of the current loss that is triggering symptoms. Target Date: 2022-10-29 Frequency: Biweekly  Progress: 0 Modality: individual  Objective Verbalize resolution of feelings of guilt and regret associated with the loss. Target Date: 2022-10-29 Frequency: Biweekly  Progress: 0 Modality: individual  Objective Implement acts of spiritual faith as a source of comfort and hope. Target Date: 2022-10-29 Frequency: Biweekly  Progress: 0 Modality: individual  11. New Goal Statement for Intimate Relationship Conflicts Objective Identify problems and strengths in the relationship,  including one's own role in each. Target Date: 2022-10-29 Frequency: Biweekly  Progress: 0 Modality: individual  Objective Learn and implement problem-solving and conflict resolution skills. Target Date: 2022-10-29 Frequency: Biweekly  Progress: 0 Modality: individual  Objective Increase flexibility of expectations,  willingness to compromise, and acceptance of irreconcilable differences. Target Date: 2022-10-29 Frequency: Biweekly  Progress: 0 Modality: individual  Objective Understand the origin of each other's negative emotions and reactions and develop more constructive interactions that fill needs. Target Date: 2022-10-29 Frequency: Biweekly  Progress: 0 Modality: individual  Objective Increase the frequency of the direct expression of honest, respectful, and positive feelings and thoughts within the relationship. Target Date: 2022-10-29 Frequency: Biweekly  Progress: 0 Modality: individual  12. Patient will have reasonable, healthy expectations regarding a balance between self-care and daughter's needs. Objective Learn and implement parenting practices that have demonstrated effectiveness. Target Date: 2022-10-29 Frequency: Biweekly  Progress: 0 Modality: individual  Objective Verbalize a sense of increased skill, effectiveness, and confidence in parenting. Target Date: 2022-10-29 Frequency: Biweekly  Progress: 0 Modality: individual  Objective Freely express feelings of frustration, helplessness, and inadequacy that each experiences in the parenting role. Target Date: 2022-10-29 Frequency: Biweekly  Progress: 0 Modality: individual  Objective Identify unresolved childhood issues that affect parenting and work toward their resolution. Target Date: 2022-10-29 Frequency: Biweekly  Progress: 0 Modality: individual  Objective Decrease outside pressures, demands, and distractions that drain energy and time from the family. Target Date: 2022-10-29 Frequency: Biweekly  Progress:  0 Modality: individual  13. Reach a realistic view and approach to parenting, given the child's developmental level. 14. Reduce overall frequency, intensity, and duration of the anxiety so that daily functioning is not impaired. 15. Resolve the core conflict that is the source of anxiety. 16. Stabilize anxiety level while increasing ability to function on a daily basis.  Diagnosis:Generalized anxiety disorder  Major depressive disorder, recurrent episode, moderate (HCC)  Attention deficit hyperactivity disorder (ADHD), predominantly inattentive type  Plan:  -complete values self-exploration exercise -meet again on Thursday, December 13, 2021 at 12pm in-person  Francie Massing, [facility]

## 2021-11-30 ENCOUNTER — Encounter: Payer: Self-pay | Admitting: Medical-Surgical

## 2021-11-30 DIAGNOSIS — R5383 Other fatigue: Secondary | ICD-10-CM

## 2021-11-30 MED ORDER — MELOXICAM 15 MG PO TABS
15.0000 mg | ORAL_TABLET | Freq: Every day | ORAL | 0 refills | Status: DC
  Filled 2021-11-30: qty 90, 90d supply, fill #0

## 2021-12-01 ENCOUNTER — Other Ambulatory Visit (HOSPITAL_COMMUNITY): Payer: Self-pay

## 2021-12-04 ENCOUNTER — Other Ambulatory Visit: Payer: Self-pay

## 2021-12-04 DIAGNOSIS — R5383 Other fatigue: Secondary | ICD-10-CM

## 2021-12-04 NOTE — Progress Notes (Signed)
Cortisol order placed.  ?

## 2021-12-12 ENCOUNTER — Other Ambulatory Visit (HOSPITAL_COMMUNITY): Payer: Self-pay

## 2021-12-13 ENCOUNTER — Ambulatory Visit (INDEPENDENT_AMBULATORY_CARE_PROVIDER_SITE_OTHER): Payer: No Typology Code available for payment source | Admitting: Professional

## 2021-12-13 ENCOUNTER — Other Ambulatory Visit: Payer: Self-pay

## 2021-12-13 ENCOUNTER — Encounter: Payer: Self-pay | Admitting: Professional

## 2021-12-13 DIAGNOSIS — F900 Attention-deficit hyperactivity disorder, predominantly inattentive type: Secondary | ICD-10-CM

## 2021-12-13 DIAGNOSIS — F331 Major depressive disorder, recurrent, moderate: Secondary | ICD-10-CM | POA: Diagnosis not present

## 2021-12-13 DIAGNOSIS — F411 Generalized anxiety disorder: Secondary | ICD-10-CM | POA: Diagnosis not present

## 2021-12-13 NOTE — Progress Notes (Signed)
[facility] Counselor/Therapist Progress Note ? ?Patient ID: Kelly Haynes, MRN: [id number],   ? ?Date: 12/13/2021 ? ?Time Spent: 54 minutes 08-1255 pm  ? ?Treatment Type: Individual Therapy ? ?Risk Assessment: ?Danger to Self:  No ?Self-injurious Behavior: No ?Danger to Others: No ? ?Subjective: This session was held via video teletherapy due to the coronavirus risk at this time. The patient consented to video teletherapy and was located at her home during this session. She is aware it is the responsibility of the patient to secure confidentiality on her end of the session. The provider was in a private home office for the duration of this session.  ? ?The patient arrived on time for her webex appointment. ? ?Issues addressed: ?1- relationship with Audelia Acton ?-she has felt a lot better since the breakup ?-he texts once in a while and she responds ?-she does miss him but thinks she needs more time because she feels beter without him ?  -he was a good listener ?  -he was very smart and a good problem solver ?  -he did not try to fix things unless she asked ?  -he care for her needs ?-the amount of time he wanted was overwhelming for her ?-she could never give enough ?  -she could tell because he would act weird ?  -she felt like she was always getting in trouble ?-pt could imagine a place in her life if he could be more relaxed and not so analytical ?  -it let him define how he was feeling ?-she tried to get a break from him before she broke up ?  -he would try and adjust but could not ?-pt and Shane immediately moved into hi being at her house and do everything together ?2-relationship with Emory's father ?-he is not available when it would be helpful to her and she is tired of making allowances for him ?3-not happy with her medical provider ?-discussed options ?  -Dr. Madilyn Fireman will see pt as needed until Dr. Herb Grays begins in July ? ?Treatment Plan ?Problems Addressed  ?Anxiety, Grief / Loss Unresolved,  Intimate Relationship Conflicts, Parenting  ?Goals ?1. Achieve a greater level of family connectedness. ?2. Achieve a level of competent, effective parenting. ?3. Begin a healthy grieving process around the loss. ?4. Complete the process of letting go of the lost significant other. ?5. Develop the necessary skills for effective, open communication, mutually satisfying sexual intimacy, and enjoyable time for companionship within the relationship. ?6. Enhance ability to effectively cope with the full variety of life's worries and anxieties. ?7. Increase awareness of own role in the relationship conflicts. ?8. Learn and implement coping skills that result in a reduction of anxiety and worry, and improved daily functioning. ?9. New Goal Statement for Anxiety ?Objective ?Describe situations, thoughts, feelings, and actions associated with anxieties and worries, their impact on functioning, and attempts to resolve them. ?Target Date: 2022-10-29 Frequency: Biweekly  ?Progress: 0 Modality: individual  ?Related Interventions ?Ask the client to describe his/her past experiences of anxiety and their impact on functioning; assess the focus, excessiveness, and uncontrollability of the worry and the type, frequency, intensity, and duration of his/her anxiety symptoms (consider using a structured interview such as The Anxiety Disorders Interview Schedule-Adult Version). ?Objective ?Verbalize an understanding of the cognitive, physiological, and behavioral components of anxiety and its treatment. ?Target Date: 2022-10-29 Frequency: Biweekly  ?Progress: 0 Modality: individual  ?Related Interventions ?Discuss how treatment targets worry, anxiety symptoms, and avoidance to help the client manage worry  effectively, reduce overarousal, and eliminate unnecessary avoidance. ?Objective ?Learn and implement calming skills to reduce overall anxiety and manage anxiety symptoms. ?Target Date: 2022-10-29 Frequency: Biweekly  ?Progress: 0  Modality: individual  ?Related Interventions ?Teach the client calming/relaxation skills (e.g., applied relaxation, progressive muscle relaxation, cue controlled relaxation; mindful breathing; biofeedback) and how to discriminate better between relaxation and tension; teach the client how to apply these skills to his/her daily life (e.g., New Directions in Progressive Muscle Relaxation by Casper Harrison, and Hazlett-Stevens; Treating Generalized Anxiety Disorder by Rygh and Amparo Bristol). ?Assign the client homework each session in which he/she practices relaxation exercises daily, gradually applying them progressively from non-anxiety-provoking to anxiety-provoking situations; review and reinforce success while providing corrective feedback toward improvement. ?Objective ?Identify, challenge, and replace biased, fearful self-talk with positive, realistic, and empowering self-talk. ?Target Date: 2022-10-29 Frequency: Biweekly  ?Progress: 0 Modality: individual  ?Related Interventions ?Explore the client's schema and self-talk that mediate his/her fear response; assist him/her in challenging the biases; replace the distorted messages with reality-based alternatives and positive, realistic self-talk that will increase his/her self-confidence in coping with irrational fears (see Cognitive Therapy of Anxiety Disorders by Alison Stalling). ?Objective ?Learn and implement relapse prevention strategies for managing possible future anxiety symptoms. ?Target Date: 2022-10-29 Frequency: Biweekly  ?Progress: 0 Modality: individual  ?Objective ?Learn and implement problem-solving strategies for realistically addressing worries. ?Target Date: 2022-10-29 Frequency: Biweekly  ?Progress: 0 Modality: individual  ?Related Interventions ?Teach the client problem-solving strategies involving specifically defining a problem, generating options for addressing it, evaluating the pros and cons of each option, selecting and implementing an  optional action, and reevaluating and refining the action (or assign "Applying Problem-Solving to Interpersonal Conflict" in the Adult Psychotherapy Homework Planner by Bryn Gulling). ?Assign the client a homework exercise in which he/she problem-solves a current problem (see Mastery of Your Anxiety and Worry: Workbook by Adora Fridge and Eliot Ford or Generalized Anxiety Disorder by Eather Colas, and Eliot Ford); review, reinforce success, and provide corrective feedback toward improvement. ?Objective ?Undergo gradual repeated imaginal exposure to the feared negative consequences predicted by worries and develop alternative reality-based predictions. ?Target Date: 2022-10-29 Frequency: Biweekly  ?Progress: 0 Modality: individual  ?10. New Goal Statement for Grief / Loss Unresolved ?Objective ?Begin verbalizing feelings associated with the loss. ?Target Date: 2022-10-29 Frequency: Biweekly  ?Progress: 0 Modality: individual  ?Objective ?Tell in detail the story of the current loss that is triggering symptoms. ?Target Date: 2022-10-29 Frequency: Biweekly  ?Progress: 0 Modality: individual  ?Objective ?Verbalize resolution of feelings of guilt and regret associated with the loss. ?Target Date: 2022-10-29 Frequency: Biweekly  ?Progress: 0 Modality: individual  ?Objective ?Implement acts of spiritual faith as a source of comfort and hope. ?Target Date: 2022-10-29 Frequency: Biweekly  ?Progress: 0 Modality: individual  ?11. New Goal Statement for Intimate Relationship Conflicts ?Objective ?Identify problems and strengths in the relationship, including one's own role in each. ?Target Date: 2022-10-29 Frequency: Biweekly  ?Progress: 0 Modality: individual  ?Objective ?Learn and implement problem-solving and conflict resolution skills. ?Target Date: 2022-10-29 Frequency: Biweekly  ?Progress: 0 Modality: individual  ?Objective ?Increase flexibility of expectations, willingness to compromise, and acceptance of irreconcilable  differences. ?Target Date: 2022-10-29 Frequency: Biweekly  ?Progress: 0 Modality: individual  ?Objective ?Understand the origin of each other's negative emotions and reactions and develop more constructive interactions that f

## 2021-12-14 LAB — CORTISOL: Cortisol, Plasma: 14.1 ug/dL

## 2021-12-27 ENCOUNTER — Ambulatory Visit: Payer: No Typology Code available for payment source | Admitting: Professional

## 2022-01-09 ENCOUNTER — Other Ambulatory Visit (HOSPITAL_COMMUNITY): Payer: Self-pay

## 2022-01-09 ENCOUNTER — Encounter: Payer: Self-pay | Admitting: Professional

## 2022-01-09 ENCOUNTER — Ambulatory Visit (INDEPENDENT_AMBULATORY_CARE_PROVIDER_SITE_OTHER): Payer: No Typology Code available for payment source | Admitting: Professional

## 2022-01-09 DIAGNOSIS — F411 Generalized anxiety disorder: Secondary | ICD-10-CM

## 2022-01-09 DIAGNOSIS — F900 Attention-deficit hyperactivity disorder, predominantly inattentive type: Secondary | ICD-10-CM | POA: Diagnosis not present

## 2022-01-09 DIAGNOSIS — F331 Major depressive disorder, recurrent, moderate: Secondary | ICD-10-CM | POA: Diagnosis not present

## 2022-01-09 MED ORDER — AMPHETAMINE-DEXTROAMPHET ER 20 MG PO CP24
20.0000 mg | ORAL_CAPSULE | Freq: Two times a day (BID) | ORAL | 0 refills | Status: DC
  Filled 2022-01-09: qty 60, 30d supply, fill #0

## 2022-01-09 MED ORDER — AMPHETAMINE-DEXTROAMPHET ER 20 MG PO CP24
20.0000 mg | ORAL_CAPSULE | Freq: Two times a day (BID) | ORAL | 0 refills | Status: DC
  Filled 2022-05-20: qty 60, 30d supply, fill #0

## 2022-01-09 MED ORDER — FLUOXETINE HCL 20 MG PO CAPS
40.0000 mg | ORAL_CAPSULE | Freq: Every morning | ORAL | 2 refills | Status: DC
  Filled 2022-01-09: qty 60, 30d supply, fill #0

## 2022-01-09 NOTE — Progress Notes (Signed)
[facility] Counselor/Therapist Progress Note ? ?Patient ID: Kelly Haynes, MRN: [id number],   ? ?Date: 01/09/2022 ? ?Time Spent: 48 minutes 4-448pm  ? ?Treatment Type: Individual Therapy ? ?Risk Assessment: ?Danger to Self:  No ?Self-injurious Behavior: No ?Danger to Others: No ? ?Subjective: This session was held via video teletherapy due to the coronavirus risk at this time. The patient consented to video teletherapy and was located at her home during this session. She is aware it is the responsibility of the patient to secure confidentiality on her end of the session. The provider was in a private home office for the duration of this session.  ? ?The patient arrived on time for her webex appointment. ? ?Issues addressed: ?1- relationship with Audelia Acton ?-they have been out on a few dates ?-he has done a lot better this time but she is already starting to not feel good about it ?-her mom told her she did not think he was a good match for him ?  -she also doesn't love that he is 6 older than her ?-since her mother shared she doesn't want to be with someone who is not a good match ?-she likes to laugh a lot and he is not very funny and doesn't do so ?-she has plans with Audelia Acton tonight and doesn't really want to go ?-she has a good time when they hang out but she doesn't ever want to see him ?  -at work, she would want to talk but as a friend ?-he gets upset when she will not be intimate with him ?2-relationship with [facility]'s father Linna Hoff ?-he has taken their daughter to [place] to visit his parents ?3-self-examination ?-pt questions if there is something ?-pt's primary relationship is with her daughter ? ?Treatment Plan ?Problems Addressed  ?Anxiety, Grief / Loss Unresolved, Intimate Relationship Conflicts, Parenting  ?Goals ?1. Achieve a greater level of family connectedness. ?2. Achieve a level of competent, effective parenting. ?3. Begin a healthy grieving process around the loss. ?4. Complete the process  of letting go of the lost significant other. ?5. Develop the necessary skills for effective, open communication, mutually satisfying sexual intimacy, and enjoyable time for companionship within the relationship. ?6. Enhance ability to effectively cope with the full variety of life's worries and anxieties. ?7. Increase awareness of own role in the relationship conflicts. ?8. Learn and implement coping skills that result in a reduction of anxiety and worry, and improved daily functioning. ?9. New Goal Statement for Anxiety ?Objective ?Describe situations, thoughts, feelings, and actions associated with anxieties and worries, their impact on functioning, and attempts to resolve them. ?Target Date: 2022-10-29 Frequency: Biweekly  ?Progress: 0 Modality: individual  ?Related Interventions ?Ask the client to describe his/her past experiences of anxiety and their impact on functioning; assess the focus, excessiveness, and uncontrollability of the worry and the type, frequency, intensity, and duration of his/her anxiety symptoms (consider using a structured interview such as The Anxiety Disorders Interview Schedule-Adult Version). ?Objective ?Verbalize an understanding of the cognitive, physiological, and behavioral components of anxiety and its treatment. ?Target Date: 2022-10-29 Frequency: Biweekly  ?Progress: 0 Modality: individual  ?Related Interventions ?Discuss how treatment targets worry, anxiety symptoms, and avoidance to help the client manage worry effectively, reduce overarousal, and eliminate unnecessary avoidance. ?Objective ?Learn and implement calming skills to reduce overall anxiety and manage anxiety symptoms. ?Target Date: 2022-10-29 Frequency: Biweekly  ?Progress: 0 Modality: individual  ?Related Interventions ?Teach the client calming/relaxation skills (e.g., applied relaxation, progressive muscle relaxation, cue controlled relaxation; mindful  breathing; biofeedback) and how to discriminate better between  relaxation and tension; teach the client how to apply these skills to his/her daily life (e.g., New Directions in Progressive Muscle Relaxation by Casper Harrison, and Hazlett-Stevens; Treating Generalized Anxiety Disorder by Rygh and Amparo Bristol). ?Assign the client homework each session in which he/she practices relaxation exercises daily, gradually applying them progressively from non-anxiety-provoking to anxiety-provoking situations; review and reinforce success while providing corrective feedback toward improvement. ?Objective ?Identify, challenge, and replace biased, fearful self-talk with positive, realistic, and empowering self-talk. ?Target Date: 2022-10-29 Frequency: Biweekly  ?Progress: 0 Modality: individual  ?Related Interventions ?Explore the client's schema and self-talk that mediate his/her fear response; assist him/her in challenging the biases; replace the distorted messages with reality-based alternatives and positive, realistic self-talk that will increase his/her self-confidence in coping with irrational fears (see Cognitive Therapy of Anxiety Disorders by Alison Stalling). ?Objective ?Learn and implement relapse prevention strategies for managing possible future anxiety symptoms. ?Target Date: 2022-10-29 Frequency: Biweekly  ?Progress: 0 Modality: individual  ?Objective ?Learn and implement problem-solving strategies for realistically addressing worries. ?Target Date: 2022-10-29 Frequency: Biweekly  ?Progress: 0 Modality: individual  ?Related Interventions ?Teach the client problem-solving strategies involving specifically defining a problem, generating options for addressing it, evaluating the pros and cons of each option, selecting and implementing an optional action, and reevaluating and refining the action (or assign "Applying Problem-Solving to Interpersonal Conflict" in the Adult Psychotherapy Homework Planner by Bryn Gulling). ?Assign the client a homework exercise in which he/she  problem-solves a current problem (see Mastery of Your Anxiety and Worry: Workbook by Adora Fridge and Eliot Ford or Generalized Anxiety Disorder by Eather Colas, and Eliot Ford); review, reinforce success, and provide corrective feedback toward improvement. ?Objective ?Undergo gradual repeated imaginal exposure to the feared negative consequences predicted by worries and develop alternative reality-based predictions. ?Target Date: 2022-10-29 Frequency: Biweekly  ?Progress: 0 Modality: individual  ?10. New Goal Statement for Grief / Loss Unresolved ?Objective ?Begin verbalizing feelings associated with the loss. ?Target Date: 2022-10-29 Frequency: Biweekly  ?Progress: 0 Modality: individual  ?Objective ?Tell in detail the story of the current loss that is triggering symptoms. ?Target Date: 2022-10-29 Frequency: Biweekly  ?Progress: 0 Modality: individual  ?Objective ?Verbalize resolution of feelings of guilt and regret associated with the loss. ?Target Date: 2022-10-29 Frequency: Biweekly  ?Progress: 0 Modality: individual  ?Objective ?Implement acts of spiritual faith as a source of comfort and hope. ?Target Date: 2022-10-29 Frequency: Biweekly  ?Progress: 0 Modality: individual  ?11. New Goal Statement for Intimate Relationship Conflicts ?Objective ?Identify problems and strengths in the relationship, including one's own role in each. ?Target Date: 2022-10-29 Frequency: Biweekly  ?Progress: 0 Modality: individual  ?Objective ?Learn and implement problem-solving and conflict resolution skills. ?Target Date: 2022-10-29 Frequency: Biweekly  ?Progress: 0 Modality: individual  ?Objective ?Increase flexibility of expectations, willingness to compromise, and acceptance of irreconcilable differences. ?Target Date: 2022-10-29 Frequency: Biweekly  ?Progress: 0 Modality: individual  ?Objective ?Understand the origin of each other's negative emotions and reactions and develop more constructive interactions that fill needs. ?Target Date:  2022-10-29 Frequency: Biweekly  ?Progress: 0 Modality: individual  ?Objective ?Increase the frequency of the direct expression of honest, respectful, and positive feelings and thoughts within the relationship. ?

## 2022-01-10 ENCOUNTER — Ambulatory Visit: Payer: No Typology Code available for payment source | Admitting: Professional

## 2022-01-29 ENCOUNTER — Ambulatory Visit: Payer: No Typology Code available for payment source | Admitting: Neurology

## 2022-01-29 ENCOUNTER — Encounter: Payer: Self-pay | Admitting: Neurology

## 2022-01-29 VITALS — BP 114/77 | HR 80 | Ht 66.0 in | Wt 148.8 lb

## 2022-01-29 DIAGNOSIS — R400 Somnolence: Secondary | ICD-10-CM

## 2022-01-29 DIAGNOSIS — G4710 Hypersomnia, unspecified: Secondary | ICD-10-CM | POA: Diagnosis not present

## 2022-01-29 DIAGNOSIS — G478 Other sleep disorders: Secondary | ICD-10-CM

## 2022-01-29 DIAGNOSIS — R6889 Other general symptoms and signs: Secondary | ICD-10-CM

## 2022-01-29 NOTE — Progress Notes (Signed)
Subjective:  ?  ?Patient ID: Kelly Haynes is a 31 y.o. female. ? ?HPI ? ? ? ?Star Age, MD, PhD ?[facility] ?[address] ?[phone] ?[place], [place] [address] ? ?Dear Kelly Haynes, ? ?I saw your patient, Kelly Haynes, upon your kind request in my sleep clinic today for initial consultation of her sleep disorder, in particular, her significant daytime somnolence.  The patient is unaccompanied today.  As you know, Kelly Haynes is a 31 year old right-handed woman with an underlying medical history of ADD or ADHD, depression, anxiety, and reflux disease, who reports a longstanding history of excessive daytime somnolence, essentially since high school days.  She was sleeping in class but did not typically doze off in class.  He is sleepy at the wheel and sometimes has to pull over and take a short nap.  He reports vivid dreams but no telltale hypnagogic or hypnopompic hallucinations, has had sleep paralysis episodes infrequently and denies any typical cataplectic events.  She has no family history of sleep apnea or narcolepsy.  She has been on Adderall off and on for about 4 years, she took a 2-year gap when she was breast-feeding her daughter who is now 76 years old.  The patient is single and lives with her daughter who stays with the dad during the day.  Patient works as a Agricultural consultant for [employer].  She used to work nights and is currently working days.  She does not have a significant history of snoring or witnessed apneas, denies recurrent morning headaches.  She has nocturia about once per average night.  She limits her caffeine.  She does not have a TV in her bedroom.  She tries to keep good sleep hygiene.  They have 2 cats in the household, bedtime is around 10 PM and rise time around 6:30 AM to 7 AM.  She has a history of anxiety and depression, mostly anxiety.  She started Celexa when she was about 31 years old and then was changed to Prozac about 6 to 8 months ago.  She is a  non-smoker and drinks alcohol infrequently, maybe twice a month and drinks 1 cup of coffee in the morning.  She is currently on Adderall 20 mg strength but takes only 1 a day instead of twice daily.  She is currently taking Prozac 40 mg daily. Her Epworth sleepiness score is 18 out of 24, fatigue severity score is 58 out of 63. ? ?Her Past Medical History Is Significant For: ?Past Medical History:  ?Diagnosis Date  ? Abnormal Pap smear of cervix   ? ADHD   ? Anxiety state 06/16/2015  ? Depression 06/16/2015  ? GERD (gastroesophageal reflux disease) 06/13/2015  ? Lactose intolerance 06/16/2015  ? ? ?Her Past Surgical History Is Significant For: ?Past Surgical History:  ?Procedure Laterality Date  ? COCCYX REMOVAL  2012  ? TONSILLECTOMY  06/16/2015  ? [place] EXTRACTION  2009  ? ? ?Her Family History Is Significant For: ?Family History  ?Problem Relation Age of Onset  ? Healthy Mother   ? Depression Father   ? Other Father   ?     Pre-cancerous colon polyps approx 2015  ? Retinitis pigmentosa Father   ? Depression Sister   ? Prostate cancer Maternal Grandfather   ? Hypertension Maternal Grandfather   ? Depression Paternal Grandmother   ? Hypertension Paternal Grandmother   ? Hypertension Paternal Grandfather   ? Prostate cancer Paternal Grandfather   ? Depression Cousin   ?  Colon cancer Neg Hx   ? Esophageal cancer Neg Hx   ? Rectal cancer Neg Hx   ? Stomach cancer Neg Hx   ? Pancreatic cancer Neg Hx   ? Liver disease Neg Hx   ? ? ?Her Social History Is Significant For: ?Social History  ? ?Socioeconomic History  ? Marital status: Single  ?  Spouse name: Not on file  ? Number of children: 0  ? Years of education: BSN  ? Highest education level: Not on file  ?Occupational History  ? Occupation: Management consultant  ?  Comment: [employer]  ?Tobacco Use  ? Smoking status: Never  ? Smokeless tobacco: Never  ?Vaping Use  ? Vaping Use: Never used  ?Substance and Sexual Activity  ? Alcohol use: Yes  ?  Comment: 1-2 drinks pre month   ? Drug use: Never  ? Sexual activity: Yes  ?  Partners: Male  ?  Birth control/protection: I.U.D.  ?Other Topics Concern  ? Not on file  ?Social History Narrative  ? Caffeine 1 cup every AM  ? Mother lives in [place], [place].  ? Father lives in [place], [place].  ? Smoke detectors in home? Yes   ? Guns in home? No   ? Consistent seatbelt use? Yes   ? Consistent dental brushing and flossing? Yes   ? Semi-Annual dental visits: Yes   ? Annual Eye exams: last visit 01/2017  ? Lives with daughter. Works in PACU at [employer]   ? Education: Systems analyst  ?   ? ?Social Determinants of Health  ? ?Financial Resource Strain: Not on file  ?Food Insecurity: Not on file  ?Transportation Needs: Not on file  ?Physical Activity: Not on file  ?Stress: Not on file  ?Social Connections: Not on file  ? ? ?Her Allergies Are:  ?Allergies  ?Allergen Reactions  ? Lactose Intolerance (Gi)   ?:  ? ?Her Current Medications Are:  ?Outpatient Encounter Medications as of 01/29/2022  ?Medication Sig  ? [START ON 02/06/2022] amphetamine-dextroamphetamine (ADDERALL XR) 20 MG 24 hr capsule Take 1 capsule (20 mg total) by mouth 2 (two) times daily. (02-06-22)  ? cholecalciferol (VITAMIN D) 25 MCG (1000 UNIT) tablet Take 2,000 Units by mouth daily.  ? FLUoxetine (PROZAC) 20 MG capsule Take 2 capsules (40 mg total) by mouth in the morning.  ? levonorgestrel (MIRENA) 20 MCG/24HR IUD by Intrauterine route.  ? meloxicam (MOBIC) 15 MG tablet Take 1 tablet (15 mg total) by mouth daily.  ? Omega-3 Fatty Acids (FISH OIL OMEGA-3 PO) Take by mouth. '4080mg'$  daily  ? TURMERIC-GINGER PO Take by mouth. '1950mg'$  daily  ? [DISCONTINUED] FLUoxetine (PROZAC) 20 MG capsule Take 2 capsules (40 mg total) by mouth in the morning.  ? [DISCONTINUED] amphetamine-dextroamphetamine (ADDERALL XR) 20 MG 24 hr capsule Take 1 capsule (20 mg total) by mouth 2 (two) times daily.  ? [DISCONTINUED] amphetamine-dextroamphetamine (ADDERALL XR) 20 MG 24 hr capsule Take 1 capsule (20 mg total) by mouth 2  (two) times daily.  ? [DISCONTINUED] ibuprofen (ADVIL) 200 MG tablet Take 200 mg by mouth every 6 (six) hours as needed.  ? [DISCONTINUED] Multiple Vitamins-Minerals (MULTIVITAMIN PO) Take by mouth daily.  ? ?No facility-administered encounter medications on file as of 01/29/2022.  ?: ? ? ?Review of Systems:  ?Out of a complete 14 point review of systems, all are reviewed and negative with the exception of these symptoms as listed below: ? ?Review of Systems  ?Neurological:   ?  Sleep Consult.  Longstanding fatigue since HS.  No snoring, or witnessed apnea. ESS 18. FSS 58.  ? ?Objective:  ?Neurological Exam ? ?Physical Exam ?Physical Examination:  ? ?Vitals:  ? 01/29/22 1020  ?BP: 114/77  ?Pulse: 80  ? ? ?General Examination: The patient is a very pleasant 31 y.o. female in no acute distress. She appears well-developed and well-nourished and well groomed.  ? ?HEENT: Normocephalic, atraumatic, pupils are equal, round and reactive to light, extraocular tracking is good without limitation to gaze excursion or nystagmus noted. Hearing is grossly intact. Face is symmetric with normal facial animation. Speech is clear with no dysarthria noted. There is no hypophonia. There is no lip, neck/head, jaw or voice tremor. Neck is supple with full range of passive and active motion. There are no carotid bruits on auscultation. Oropharynx exam reveals: mild mouth dryness, good dental hygiene and no significant airway crowding, tonsils are absent, Mallampati class I.  Neck circumference of 12 three-quarter inches.  Minimal overbite.  Tongue protrudes centrally and palate elevates symmetrically.  ? ?Chest: Clear to auscultation without wheezing, rhonchi or crackles noted. ? ?Heart: S1+S2+0, regular and normal without murmurs, rubs or gallops noted.  ? ?Abdomen: Soft, non-tender and non-distended with normal bowel sounds appreciated on auscultation. ? ?Extremities: There is no pitting edema in the distal lower extremities  bilaterally.  ? ?Skin: Warm and dry without trophic changes noted.  ? ?Musculoskeletal: exam reveals no obvious joint deformities, tenderness or joint swelling or erythema.  ? ?Neurologically:  ?Mental status: The patient

## 2022-01-29 NOTE — Patient Instructions (Signed)
Thank you for choosing [facility] for your sleep related care! ?It was nice to meet you today! I appreciate that you entrust me with your sleep related healthcare concerns. I hope, I was able to address at least some of your concerns today, and that I can help you feel reassured and also get better.   ? ?Here is what we discussed today and what we came up with as our plan for you:  ?  ?Based on your symptoms and your exam I believe you may have an underlying sleepiness condition. We will look into your severe sleepiness with a nighttime sleep study, followed by a daytime nap study. In preparation for sleep study testing, please note the following:  ?Do not take or start any narcotic pain medication and do not start any new depression or anxiety medication or stimulant. You will have to taper off your stimulant and antidepressive medication in preparation of your sleep studies and stay off the Prozac and Adderall for at least 2 weeks prior to your study dates.   ?Please do no drive when sleepy!  ?Please keep your sleep schedule stable and do not add any additional psychotropic medications or stimulants or excess caffeine in your day to day routine, in preparation of the study.  ?4.   Our sleep lab administrative assistant will call you to schedule your sleep study. If you don't hear back from her by about 2 weeks from now, please feel free to call her at [phone]. You can leave a message with your phone number and concerns, if you get the voicemail box. She will call back as soon as possible. ?  ?The overnight sleep study will help determine whether you do or do not have OSA and how severe it is. Even, if you have mild OSA, I may want you to consider treatment with CPAP, as treatment of even borderline or mild sleep apnea can result and improvement of symptoms such as sleep disruption, daytime sleepiness, nighttime bathroom breaks, restless leg symptoms, improvement of headache syndromes, even  improved mood disorder.  ? ?Please remember, the long-term risks and ramifications of untreated moderate to severe obstructive sleep apnea are: increased Cardiovascular disease, including congestive heart failure, stroke, difficult to control hypertension, treatment resistant obesity, arrhythmias, especially irregular heartbeat commonly known as A. Fib. (atrial fibrillation); even type 2 diabetes has been linked to untreated OSA.  ? ?Sleep apnea can cause disruption of sleep and sleep deprivation in most cases, which, in turn, can cause recurrent headaches, problems with memory, mood, concentration, focus, and vigilance. Most people with untreated sleep apnea report excessive daytime sleepiness, which can affect their ability to drive. Please do not drive if you feel sleepy. Patients with sleep apnea can also develop difficulty initiating and maintaining sleep (aka insomnia).  ? ?Having sleep apnea may increase your risk for other sleep disorders, including involuntary behaviors sleep such as sleep terrors, sleep talking, sleepwalking.   ? ?Having sleep apnea can also increase your risk for restless leg syndrome and leg movements at night.  ? ?Please note that untreated obstructive sleep apnea may carry additional perioperative morbidity. Patients with significant obstructive sleep apnea (typically, in the moderate to severe degree) should receive, if possible, perioperative PAP (positive airway pressure) therapy and the surgeons and particularly the anesthesiologists should be informed of the diagnosis and the severity of the sleep disordered breathing.  ? ?I will plan to see you back after your sleep study to go over the test results and  where to go from there. We will call you after your sleep study to advise about the results (most likely, you will hear from my nurse) and to set up an appointment at the time, as necessary.   ? ?

## 2022-02-07 ENCOUNTER — Ambulatory Visit (INDEPENDENT_AMBULATORY_CARE_PROVIDER_SITE_OTHER): Payer: No Typology Code available for payment source | Admitting: Professional

## 2022-02-07 ENCOUNTER — Encounter: Payer: Self-pay | Admitting: Professional

## 2022-02-07 DIAGNOSIS — F411 Generalized anxiety disorder: Secondary | ICD-10-CM | POA: Diagnosis not present

## 2022-02-07 DIAGNOSIS — F331 Major depressive disorder, recurrent, moderate: Secondary | ICD-10-CM | POA: Diagnosis not present

## 2022-02-07 DIAGNOSIS — F900 Attention-deficit hyperactivity disorder, predominantly inattentive type: Secondary | ICD-10-CM | POA: Diagnosis not present

## 2022-02-07 NOTE — Progress Notes (Signed)
[facility] Counselor/Therapist Progress Note  Patient ID: Kelly Haynes, MRN: [id number],    Date: 02/07/2022  Time Spent: 46 minutes 1202-1248pm   Treatment Type: Individual Therapy  Risk Assessment: Danger to Self:  No Self-injurious Behavior: No Danger to Others: No  Subjective: The patient arrived late for her in-person appointment.  Issues addressed: 1- homework-did not discuss -focus on what she has learned from her dating experience and what she wants for future 2-sleep concerns -pt was seen by a neurologist who wants her to have a sleep study -she is going to have a study that include overnight and daytime (she will take five naps) -pt has to remove all medication in order to have test and is currently weaning off her psych medication   -she has noticed some negative withdrawal symptoms but is doing okay   -she is hopeful that she might not need to return to medications 3-relationship with Audelia Acton -pt ended relationship as discussed at last session -Audelia Acton was angry at her for doing so -she does not interact with him at work unless he engages -she maintains a professional posture at work with him -he has given conflictual statements about her not talking to him and then wanting her to do so   -pt took the stance of no contact that is not work related or initiated by him 4-relationship with Emory's father Linna Hoff -he has been more cooperative and friendly recently -he has been coming over the visit with [facility] and is not being unkind to pt -he ,ost his job one month ago as a [employer] social research officer, pt suspects due to alcoholism -he is no longer with girlfriend due to his alcohol issues   -he has lost the pt, his girlfriend before pt and latest gf all for alcohol issues -his mother is not healthy and he has been visiting her more 5-Emory -had a good trip with dad to visit his parents in [place] -pt worked while she was off and it helped to ease her anxiety -pt talked  with her daughter daily while gone and could see she was good which helped pt 6-measurable objectives -reviewed with patient -pt has made good progress on majority of goals -relationship goals removed since pt is no longer in conflict after ending relationship with Audelia Acton  Treatment Plan Problems Addressed  Anxiety, Grief / Loss Unresolved, Intimate Relationship Conflicts, Parenting  Goals 1. Achieve a greater level of family connectedness. 2. Achieve a level of competent, effective parenting. 3. Begin a healthy grieving process around the loss. 4. Complete the process of letting go of the lost significant other. 5. Develop the necessary skills for effective, open communication, mutually satisfying sexual intimacy, and enjoyable time for companionship within the relationship. 6. Enhance ability to effectively cope with the full variety of life's worries and anxieties. 7. Increase awareness of own role in the relationship conflicts. 8. Learn and implement coping skills that result in a reduction of anxiety and worry, and improved daily functioning. 9. New Goal Statement for Anxiety Objective Describe situations, thoughts, feelings, and actions associated with anxieties and worries, their impact on functioning, and attempts to resolve them. Target Date: 2022-10-29 Frequency: Biweekly  Progress: 60 Modality: individual  Related Interventions Ask the client to describe his/her past experiences of anxiety and their impact on functioning; assess the focus, excessiveness, and uncontrollability of the worry and the type, frequency, intensity, and duration of his/her anxiety symptoms (consider using a structured interview such as The Anxiety Disorders Interview Schedule-Adult Version). Objective Verbalize  an understanding of the cognitive, physiological, and behavioral components of anxiety and its treatment. Target Date: 2022-10-29 Frequency: Biweekly  Progress: 60 Modality: individual  Related  Interventions Discuss how treatment targets worry, anxiety symptoms, and avoidance to help the client manage worry effectively, reduce overarousal, and eliminate unnecessary avoidance. Objective Learn and implement calming skills to reduce overall anxiety and manage anxiety symptoms. Target Date: 2022-10-29 Frequency: Biweekly  Progress: 20 Modality: individual  Related Interventions Teach the client calming/relaxation skills (e.g., applied relaxation, progressive muscle relaxation, cue controlled relaxation; mindful breathing; biofeedback) and how to discriminate better between relaxation and tension; teach the client how to apply these skills to his/her daily life (e.g., New Directions in Progressive Muscle Relaxation by Casper Harrison, and Hazlett-Stevens; Treating Generalized Anxiety Disorder by Rygh and Amparo Bristol). Assign the client homework each session in which he/she practices relaxation exercises daily, gradually applying them progressively from non-anxiety-provoking to anxiety-provoking situations; review and reinforce success while providing corrective feedback toward improvement. Objective Identify, challenge, and replace biased, fearful self-talk with positive, realistic, and empowering self-talk. Target Date: 2022-10-29 Frequency: Biweekly  Progress: 40 Modality: individual  Related Interventions Explore the client's schema and self-talk that mediate his/her fear response; assist him/her in challenging the biases; replace the distorted messages with reality-based alternatives and positive, realistic self-talk that will increase his/her self-confidence in coping with irrational fears (see Cognitive Therapy of Anxiety Disorders by Alison Stalling). Objective Learn and implement relapse prevention strategies for managing possible future anxiety symptoms. Target Date: 2022-10-29 Frequency: Biweekly  Progress: 20 Modality: individual  Objective Learn and implement problem-solving  strategies for realistically addressing worries. Target Date: 2022-10-29 Frequency: Biweekly  Progress: 20 Modality: individual  Related Interventions Teach the client problem-solving strategies involving specifically defining a problem, generating options for addressing it, evaluating the pros and cons of each option, selecting and implementing an optional action, and reevaluating and refining the action (or assign "Applying Problem-Solving to Interpersonal Conflict" in the Adult Psychotherapy Homework Planner by Bryn Gulling). Assign the client a homework exercise in which he/she problem-solves a current problem (see Mastery of Your Anxiety and Worry: Workbook by Adora Fridge and Eliot Ford or Generalized Anxiety Disorder by Eather Colas, and Eliot Ford); review, reinforce success, and provide corrective feedback toward improvement. Objective Undergo gradual repeated imaginal exposure to the feared negative consequences predicted by worries and develop alternative reality-based predictions. Target Date: 2022-10-29 Frequency: Biweekly  Progress: 20 Modality: individual  10. Grief / Loss Unresolved Objective Begin verbalizing feelings associated with the loss. (of relationship with Linna Hoff, potentially no sibling for [facility]) Target Date: 2022-10-29 Frequency: Biweekly  Progress: 70 Modality: individual  Objective Tell in detail the story of the current loss that is triggering symptoms. Target Date: 2022-10-29 Frequency: Biweekly  Progress: 50 Modality: individual  Objective Verbalize resolution of feelings of guilt and regret associated with the loss. Target Date: 2022-10-29 Frequency: Biweekly  Progress: 80 Modality: individual  Objective Implement acts of spiritual faith as a source of comfort and hope. Target Date: 2022-10-29 Frequency: Biweekly  Progress: 50 Modality: individual  11. Intimate Relationship Conflicts Objective Learn and implement problem-solving and conflict resolution skills. Target Date:  2022-10-29 Frequency: Biweekly  Progress: 70 Modality: individual  Objective Increase flexibility of expectations, willingness to compromise, and acceptance of irreconcilable differences. Target Date: 2022-10-29 Frequency: Biweekly  Progress: 80 Modality: individual  12. Patient will have reasonable, healthy expectations regarding a balance between self-care and daughter's needs. Objective Learn and implement parenting practices that have demonstrated effectiveness. Target Date: 2022-10-29 Frequency: Biweekly  Progress:  80 Modality: individual  Objective Verbalize a sense of increased skill, effectiveness, and confidence in parenting. Target Date: 2022-10-29 Frequency: Biweekly  Progress: 80 Modality: individual  Objective Freely express feelings of frustration, helplessness, and inadequacy that each experiences in the parenting role. Linna Hoff) Target Date: 2022-10-29 Frequency: Biweekly  Progress: 50 Modality: individual  Objective Identify unresolved childhood issues that affect parenting and work toward their resolution. Target Date: 2022-10-29 Frequency: Biweekly  Progress: 80 Modality: individual  Objective Decrease outside pressures, demands, and distractions that drain energy and time from the family. Target Date: 2022-10-29 Frequency: Biweekly  Progress: 80 Modality: individual  13. Reach a realistic view and approach to parenting, given the child's developmental level. 14. Reduce overall frequency, intensity, and duration of the anxiety so that daily functioning is not impaired. 15. Resolve the core conflict that is the source of anxiety. 16. Stabilize anxiety level while increasing ability to function on a daily basis.  Diagnosis:Generalized anxiety disorder  Major depressive disorder, recurrent episode, moderate (HCC)  Attention deficit hyperactivity disorder (ADHD), predominantly inattentive type  Plan:  -successfully managing anxiety as she is weaning off medication  (for sleep study) -meet again on Thursday, February 21, 2022 at 12pm in-person  [place], [facility]

## 2022-02-21 ENCOUNTER — Ambulatory Visit: Payer: No Typology Code available for payment source | Admitting: Professional

## 2022-02-25 ENCOUNTER — Telehealth: Payer: Self-pay | Admitting: Neurology

## 2022-02-25 NOTE — Telephone Encounter (Signed)
Sent the patient a mychart message.

## 2022-02-27 ENCOUNTER — Other Ambulatory Visit (HOSPITAL_COMMUNITY): Payer: Self-pay

## 2022-02-27 MED ORDER — AMPHETAMINE-DEXTROAMPHET ER 20 MG PO CP24
20.0000 mg | ORAL_CAPSULE | Freq: Two times a day (BID) | ORAL | 0 refills | Status: DC
  Filled 2022-02-27: qty 60, 30d supply, fill #0

## 2022-02-27 MED ORDER — AMPHETAMINE-DEXTROAMPHET ER 20 MG PO CP24: 20.0000 mg | ORAL_CAPSULE | Freq: Two times a day (BID) | ORAL | 0 refills | Status: DC

## 2022-04-11 ENCOUNTER — Other Ambulatory Visit (HOSPITAL_COMMUNITY): Payer: Self-pay

## 2022-04-11 ENCOUNTER — Encounter: Payer: Self-pay | Admitting: Professional

## 2022-04-11 ENCOUNTER — Ambulatory Visit (INDEPENDENT_AMBULATORY_CARE_PROVIDER_SITE_OTHER): Payer: No Typology Code available for payment source | Admitting: Professional

## 2022-04-11 DIAGNOSIS — F331 Major depressive disorder, recurrent, moderate: Secondary | ICD-10-CM

## 2022-04-11 DIAGNOSIS — F411 Generalized anxiety disorder: Secondary | ICD-10-CM | POA: Diagnosis not present

## 2022-04-11 DIAGNOSIS — F900 Attention-deficit hyperactivity disorder, predominantly inattentive type: Secondary | ICD-10-CM | POA: Diagnosis not present

## 2022-04-11 MED ORDER — AMPHETAMINE-DEXTROAMPHET ER 20 MG PO CP24
20.0000 mg | ORAL_CAPSULE | Freq: Two times a day (BID) | ORAL | 0 refills | Status: DC
  Filled 2022-04-11: qty 60, 30d supply, fill #0

## 2022-04-11 MED ORDER — AMPHETAMINE-DEXTROAMPHET ER 20 MG PO CP24
20.0000 mg | ORAL_CAPSULE | Freq: Two times a day (BID) | ORAL | 0 refills | Status: DC
Start: 2022-04-11 — End: 2022-05-21
  Filled 2022-04-11: qty 60, 30d supply, fill #0

## 2022-04-11 MED ORDER — FLUOXETINE HCL 20 MG PO CAPS
40.0000 mg | ORAL_CAPSULE | Freq: Every morning | ORAL | 2 refills | Status: DC
  Filled 2022-04-11: qty 60, 30d supply, fill #0

## 2022-04-11 NOTE — Progress Notes (Signed)
[facility] Counselor/Therapist Progress Note  Patient ID: Kelly Haynes, MRN: [id number],    Date: 04/13/2022  Time Spent: 46 minutes 1202-1248pm   Treatment Type: Individual Therapy  Risk Assessment: Danger to Self:  No Self-injurious Behavior: No Danger to Others: No  Subjective: The patient arrived on time for her in-person appointment.  Issues addressed: 1- homework-did not discuss -focus on what she has learned from her dating experience and what she wants for future 2-physical health -she is going to have a study that include overnight and daytime (she will take five naps) -pt is psych medication-free for the first time since college and does not notice much of a difference -pt  3-relationship with Emory's father Linna Hoff -he has been at the pt's home more than she prefers -pt traveled with [employer] to paternal aunt's wedding   -pt overheard Linna Hoff talking about his work     -pt knows he is unemployed and is so angry that he is dishonest   -his drinking got him fired   -he told her he cannot afford to pay their daughter's school tuition     -pt learned from him he had been making about 150K and never contributed more to her care -pt contemplates revising the parental agreement but thinks about cost -he is always in her business   -pt becomes frustrated and negatively responds to him   -he asked her what she is doing and where she is and with whom 5-Emory -verbalizes that she does not want her dad to come over -verbalizes that she does not want to go to his house -pt reports she is careful not to verbalize her frustrations around their daughter  Treatment Plan Problems Addressed  Anxiety, Grief / Loss Unresolved, Intimate Relationship Conflicts, Parenting  Goals 1. Achieve a greater level of family connectedness. 2. Achieve a level of competent, effective parenting. 3. Begin a healthy grieving process around the loss. 4. Complete the process of letting go of  the lost significant other. 5. Develop the necessary skills for effective, open communication, mutually satisfying sexual intimacy, and enjoyable time for companionship within the relationship. 6. Enhance ability to effectively cope with the full variety of life's worries and anxieties. 7. Increase awareness of own role in the relationship conflicts. 8. Learn and implement coping skills that result in a reduction of anxiety and worry, and improved daily functioning. 9. New Goal Statement for Anxiety Objective Describe situations, thoughts, feelings, and actions associated with anxieties and worries, their impact on functioning, and attempts to resolve them. Target Date: 2022-10-29 Frequency: Biweekly  Progress: 60 Modality: individual  Related Interventions Ask the client to describe his/her past experiences of anxiety and their impact on functioning; assess the focus, excessiveness, and uncontrollability of the worry and the type, frequency, intensity, and duration of his/her anxiety symptoms (consider using a structured interview such as The Anxiety Disorders Interview Schedule-Adult Version). Objective Verbalize an understanding of the cognitive, physiological, and behavioral components of anxiety and its treatment. Target Date: 2022-10-29 Frequency: Biweekly  Progress: 60 Modality: individual  Related Interventions Discuss how treatment targets worry, anxiety symptoms, and avoidance to help the client manage worry effectively, reduce overarousal, and eliminate unnecessary avoidance. Objective Learn and implement calming skills to reduce overall anxiety and manage anxiety symptoms. Target Date: 2022-10-29 Frequency: Biweekly  Progress: 20 Modality: individual  Related Interventions Teach the client calming/relaxation skills (e.g., applied relaxation, progressive muscle relaxation, cue controlled relaxation; mindful breathing; biofeedback) and how to discriminate better between relaxation  and  tension; teach the client how to apply these skills to his/her daily life (e.g., New Directions in Progressive Muscle Relaxation by Casper Harrison, and Hazlett-Stevens; Treating Generalized Anxiety Disorder by Rygh and Amparo Bristol). Assign the client homework each session in which he/she practices relaxation exercises daily, gradually applying them progressively from non-anxiety-provoking to anxiety-provoking situations; review and reinforce success while providing corrective feedback toward improvement. Objective Identify, challenge, and replace biased, fearful self-talk with positive, realistic, and empowering self-talk. Target Date: 2022-10-29 Frequency: Biweekly  Progress: 40 Modality: individual  Related Interventions Explore the client's schema and self-talk that mediate his/her fear response; assist him/her in challenging the biases; replace the distorted messages with reality-based alternatives and positive, realistic self-talk that will increase his/her self-confidence in coping with irrational fears (see Cognitive Therapy of Anxiety Disorders by Alison Stalling). Objective Learn and implement relapse prevention strategies for managing possible future anxiety symptoms. Target Date: 2022-10-29 Frequency: Biweekly  Progress: 20 Modality: individual  Objective Learn and implement problem-solving strategies for realistically addressing worries. Target Date: 2022-10-29 Frequency: Biweekly  Progress: 20 Modality: individual  Related Interventions Teach the client problem-solving strategies involving specifically defining a problem, generating options for addressing it, evaluating the pros and cons of each option, selecting and implementing an optional action, and reevaluating and refining the action (or assign "Applying Problem-Solving to Interpersonal Conflict" in the Adult Psychotherapy Homework Planner by Bryn Gulling). Assign the client a homework exercise in which he/she problem-solves a  current problem (see Mastery of Your Anxiety and Worry: Workbook by Adora Fridge and Eliot Ford or Generalized Anxiety Disorder by Eather Colas, and Eliot Ford); review, reinforce success, and provide corrective feedback toward improvement. Objective Undergo gradual repeated imaginal exposure to the feared negative consequences predicted by worries and develop alternative reality-based predictions. Target Date: 2022-10-29 Frequency: Biweekly  Progress: 20 Modality: individual  10. Grief / Loss Unresolved Objective Begin verbalizing feelings associated with the loss. (of relationship with Linna Hoff, potentially no sibling for [facility]) Target Date: 2022-10-29 Frequency: Biweekly  Progress: 70 Modality: individual  Objective Tell in detail the story of the current loss that is triggering symptoms. Target Date: 2022-10-29 Frequency: Biweekly  Progress: 50 Modality: individual  Objective Verbalize resolution of feelings of guilt and regret associated with the loss. Target Date: 2022-10-29 Frequency: Biweekly  Progress: 80 Modality: individual  Objective Implement acts of spiritual faith as a source of comfort and hope. Target Date: 2022-10-29 Frequency: Biweekly  Progress: 50 Modality: individual  11. Intimate Relationship Conflicts Objective Learn and implement problem-solving and conflict resolution skills. Target Date: 2022-10-29 Frequency: Biweekly  Progress: 70 Modality: individual  Objective Increase flexibility of expectations, willingness to compromise, and acceptance of irreconcilable differences. Target Date: 2022-10-29 Frequency: Biweekly  Progress: 80 Modality: individual  12. Patient will have reasonable, healthy expectations regarding a balance between self-care and daughter's needs. Objective Learn and implement parenting practices that have demonstrated effectiveness. Target Date: 2022-10-29 Frequency: Biweekly  Progress: 80 Modality: individual  Objective Verbalize a sense of increased  skill, effectiveness, and confidence in parenting. Target Date: 2022-10-29 Frequency: Biweekly  Progress: 80 Modality: individual  Objective Freely express feelings of frustration, helplessness, and inadequacy that each experiences in the parenting role. Linna Hoff) Target Date: 2022-10-29 Frequency: Biweekly  Progress: 50 Modality: individual  Objective Identify unresolved childhood issues that affect parenting and work toward their resolution. Target Date: 2022-10-29 Frequency: Biweekly  Progress: 80 Modality: individual  Objective Decrease outside pressures, demands, and distractions that drain energy and time from the family. Target Date: 2022-10-29 Frequency: Biweekly  Progress: 80  Modality: individual  13. Reach a realistic view and approach to parenting, given the child's developmental level. 14. Reduce overall frequency, intensity, and duration of the anxiety so that daily functioning is not impaired. 15. Resolve the core conflict that is the source of anxiety. 16. Stabilize anxiety level while increasing ability to function on a daily basis.  Diagnosis:Generalized anxiety disorder  Major depressive disorder, recurrent episode, moderate (HCC)  Attention deficit hyperactivity disorder (ADHD), predominantly inattentive type  Plan:  -meet again on Tuesday, April 23, 2022 at 2pm in-person  [place], [facility]

## 2022-04-12 ENCOUNTER — Other Ambulatory Visit (HOSPITAL_COMMUNITY): Payer: Self-pay

## 2022-04-17 ENCOUNTER — Encounter: Payer: Self-pay | Admitting: Neurology

## 2022-04-23 ENCOUNTER — Encounter: Payer: Self-pay | Admitting: Professional

## 2022-04-23 ENCOUNTER — Telehealth: Payer: Self-pay

## 2022-04-23 ENCOUNTER — Ambulatory Visit (INDEPENDENT_AMBULATORY_CARE_PROVIDER_SITE_OTHER): Payer: No Typology Code available for payment source | Admitting: Professional

## 2022-04-23 DIAGNOSIS — F331 Major depressive disorder, recurrent, moderate: Secondary | ICD-10-CM | POA: Diagnosis not present

## 2022-04-23 DIAGNOSIS — F900 Attention-deficit hyperactivity disorder, predominantly inattentive type: Secondary | ICD-10-CM

## 2022-04-23 DIAGNOSIS — F411 Generalized anxiety disorder: Secondary | ICD-10-CM

## 2022-04-23 NOTE — Progress Notes (Signed)
[facility] Counselor/Therapist Progress Note  Patient ID: Kelly Haynes, MRN: [id number],    Date: 04/23/2022  Time Spent: 41 minutes 201-242pm   Treatment Type: Individual Therapy  Risk Assessment: Danger to Self:  No Self-injurious Behavior: No Danger to Others: No  Subjective: This session was held via video teletherapy. The patient consented to video teletherapy and was located at her home during this session. She is aware it is the responsibility of the patient to secure confidentiality on her end of the session. The provider was in a private home office for the duration of this session.   The patient arrived on time for her webex session.  Issues addressed: 1-[place] -talked to [facility] about the schedule and they agreed upon a schedule that would not include them being in each other's space   -he started work and stated he could not come as much as he had since he started work -Warren Lacy seems to do okay with the plan   -she still wants her mom to stay when pt drops her off but Linna Hoff say she's playing and is fine when she gets home 2-new schedule gives her some time to herself   -she does miss the time with her daughter since she is asleep when she comes home from work -dating Jenny Reichmann   -hands out with him on the Saturday at the pool -sister comes over for dinner 3-father of his daughter Linna Hoff got the same job back that he had -his boss is a recovering alcoholic and called Linna Hoff out on his behaviors -she discussed his need to pay for their daughter's school 4-[employer] school -has been considering -knows she has a lot on her plate -she would start in 2026   -has a few things to complete before applying -worries about spending enough time with Warren Lacy  Treatment Plan Problems Addressed  Anxiety, Grief / Loss Unresolved, Intimate Relationship Conflicts, Parenting  Goals 1. Achieve a greater level of family connectedness. 2. Achieve a level of competent, effective parenting. 3.  Begin a healthy grieving process around the loss. 4. Complete the process of letting go of the lost significant other. 5. Develop the necessary skills for effective, open communication, mutually satisfying sexual intimacy, and enjoyable time for companionship within the relationship. 6. Enhance ability to effectively cope with the full variety of life's worries and anxieties. 7. Increase awareness of own role in the relationship conflicts. 8. Learn and implement coping skills that result in a reduction of anxiety and worry, and improved daily functioning. 9. New Goal Statement for Anxiety Objective Describe situations, thoughts, feelings, and actions associated with anxieties and worries, their impact on functioning, and attempts to resolve them. Target Date: 2022-10-29 Frequency: Biweekly  Progress: 60 Modality: individual  Related Interventions Ask the client to describe his/her past experiences of anxiety and their impact on functioning; assess the focus, excessiveness, and uncontrollability of the worry and the type, frequency, intensity, and duration of his/her anxiety symptoms (consider using a structured interview such as The Anxiety Disorders Interview Schedule-Adult Version). Objective Verbalize an understanding of the cognitive, physiological, and behavioral components of anxiety and its treatment. Target Date: 2022-10-29 Frequency: Biweekly  Progress: 60 Modality: individual  Related Interventions Discuss how treatment targets worry, anxiety symptoms, and avoidance to help the client manage worry effectively, reduce overarousal, and eliminate unnecessary avoidance. Objective Learn and implement calming skills to reduce overall anxiety and manage anxiety symptoms. Target Date: 2022-10-29 Frequency: Biweekly  Progress: 20 Modality: individual  Related Interventions Teach the client  calming/relaxation skills (e.g., applied relaxation, progressive muscle relaxation, cue controlled  relaxation; mindful breathing; biofeedback) and how to discriminate better between relaxation and tension; teach the client how to apply these skills to his/her daily life (e.g., New Directions in Progressive Muscle Relaxation by Casper Harrison, and Hazlett-Stevens; Treating Generalized Anxiety Disorder by Rygh and Amparo Bristol). Assign the client homework each session in which he/she practices relaxation exercises daily, gradually applying them progressively from non-anxiety-provoking to anxiety-provoking situations; review and reinforce success while providing corrective feedback toward improvement. Objective Identify, challenge, and replace biased, fearful self-talk with positive, realistic, and empowering self-talk. Target Date: 2022-10-29 Frequency: Biweekly  Progress: 40 Modality: individual  Related Interventions Explore the client's schema and self-talk that mediate his/her fear response; assist him/her in challenging the biases; replace the distorted messages with reality-based alternatives and positive, realistic self-talk that will increase his/her self-confidence in coping with irrational fears (see Cognitive Therapy of Anxiety Disorders by Alison Stalling). Objective Learn and implement relapse prevention strategies for managing possible future anxiety symptoms. Target Date: 2022-10-29 Frequency: Biweekly  Progress: 20 Modality: individual  Objective Learn and implement problem-solving strategies for realistically addressing worries. Target Date: 2022-10-29 Frequency: Biweekly  Progress: 20 Modality: individual  Related Interventions Teach the client problem-solving strategies involving specifically defining a problem, generating options for addressing it, evaluating the pros and cons of each option, selecting and implementing an optional action, and reevaluating and refining the action (or assign "Applying Problem-Solving to Interpersonal Conflict" in the Adult Psychotherapy Homework  Planner by Bryn Gulling). Assign the client a homework exercise in which he/she problem-solves a current problem (see Mastery of Your Anxiety and Worry: Workbook by Adora Fridge and Eliot Ford or Generalized Anxiety Disorder by Eather Colas, and Eliot Ford); review, reinforce success, and provide corrective feedback toward improvement. Objective Undergo gradual repeated imaginal exposure to the feared negative consequences predicted by worries and develop alternative reality-based predictions. Target Date: 2022-10-29 Frequency: Biweekly  Progress: 20 Modality: individual  10. Grief / Loss Unresolved Objective Begin verbalizing feelings associated with the loss. (of relationship with Linna Hoff, potentially no sibling for [facility]) Target Date: 2022-10-29 Frequency: Biweekly  Progress: 70 Modality: individual  Objective Tell in detail the story of the current loss that is triggering symptoms. Target Date: 2022-10-29 Frequency: Biweekly  Progress: 50 Modality: individual  Objective Verbalize resolution of feelings of guilt and regret associated with the loss. Target Date: 2022-10-29 Frequency: Biweekly  Progress: 80 Modality: individual  Objective Implement acts of spiritual faith as a source of comfort and hope. Target Date: 2022-10-29 Frequency: Biweekly  Progress: 50 Modality: individual  11. Intimate Relationship Conflicts Objective Learn and implement problem-solving and conflict resolution skills. Target Date: 2022-10-29 Frequency: Biweekly  Progress: 70 Modality: individual  Objective Increase flexibility of expectations, willingness to compromise, and acceptance of irreconcilable differences. Target Date: 2022-10-29 Frequency: Biweekly  Progress: 80 Modality: individual  12. Patient will have reasonable, healthy expectations regarding a balance between self-care and daughter's needs. Objective Learn and implement parenting practices that have demonstrated effectiveness. Target Date: 2022-10-29  Frequency: Biweekly  Progress: 80 Modality: individual  Objective Verbalize a sense of increased skill, effectiveness, and confidence in parenting. Target Date: 2022-10-29 Frequency: Biweekly  Progress: 80 Modality: individual  Objective Freely express feelings of frustration, helplessness, and inadequacy that each experiences in the parenting role. Linna Hoff) Target Date: 2022-10-29 Frequency: Biweekly  Progress: 50 Modality: individual  Objective Identify unresolved childhood issues that affect parenting and work toward their resolution. Target Date: 2022-10-29 Frequency: Biweekly  Progress: 80 Modality: individual  Objective Decrease outside pressures, demands, and distractions that drain energy and time from the family. Target Date: 2022-10-29 Frequency: Biweekly  Progress: 80 Modality: individual  13. Reach a realistic view and approach to parenting, given the child's developmental level. 14. Reduce overall frequency, intensity, and duration of the anxiety so that daily functioning is not impaired. 15. Resolve the core conflict that is the source of anxiety. 16. Stabilize anxiety level while increasing ability to function on a daily basis.  Diagnosis:Generalized anxiety disorder  Major depressive disorder, recurrent episode, moderate (HCC)  Attention deficit hyperactivity disorder (ADHD), predominantly inattentive type  Plan:  -meet again on Tuesday, May 14, 2022 at [place], [employer]

## 2022-04-23 NOTE — Telephone Encounter (Signed)
LVM for pt to call back to let sleep lab know if she would like to proceed with sleep studies.

## 2022-04-24 NOTE — Progress Notes (Unsigned)
     Established patient visit   Patient: Kelly Haynes   DOB: 08-23-1991   31 y.o. Female  MRN: [id number] Visit Date: 04/25/2022  Today's healthcare provider: Owens Loffler, DO   No chief complaint on file.   SUBJECTIVE   No chief complaint on file.  HPI    Review of Systems     No outpatient medications have been marked as taking for the 04/25/22 encounter (Appointment) with Owens Loffler, DO.    OBJECTIVE    There were no vitals taken for this visit.  Physical Exam   {Show previous labs (optional):23736}    ASSESSMENT & PLAN    Problem List Items Addressed This Visit   None   No follow-ups on file.      No orders of the defined types were placed in this encounter.   No orders of the defined types were placed in this encounter.    Owens Loffler, DO  [facility] [phone] (phone) [phone] (fax)  [place]

## 2022-04-25 ENCOUNTER — Ambulatory Visit (INDEPENDENT_AMBULATORY_CARE_PROVIDER_SITE_OTHER): Payer: No Typology Code available for payment source | Admitting: Family Medicine

## 2022-04-25 VITALS — BP 102/70 | HR 103 | Ht 66.0 in | Wt 143.0 lb

## 2022-04-25 DIAGNOSIS — L989 Disorder of the skin and subcutaneous tissue, unspecified: Secondary | ICD-10-CM

## 2022-04-25 DIAGNOSIS — B079 Viral wart, unspecified: Secondary | ICD-10-CM | POA: Insufficient documentation

## 2022-04-25 DIAGNOSIS — B070 Plantar wart: Secondary | ICD-10-CM | POA: Insufficient documentation

## 2022-04-25 DIAGNOSIS — Z13220 Encounter for screening for lipoid disorders: Secondary | ICD-10-CM | POA: Diagnosis not present

## 2022-04-25 NOTE — Assessment & Plan Note (Addendum)
-   took skin biopsy of back lesion that was irregularly discolored  - took biopsy of left thigh lesion  - sent off to pathology

## 2022-04-26 LAB — LIPID PANEL
Cholesterol: 183 mg/dL (ref <200)
HDL: 67 mg/dL (ref >=50)
LDL Cholesterol (Calc): 92 mg/dL (calc)
Non-HDL Cholesterol (Calc): 116 mg/dL (calc) (ref <130)
Total CHOL/HDL Ratio: 2.7 (calc) (ref <5.0)
Triglycerides: 142 mg/dL (ref <150)

## 2022-05-09 NOTE — Telephone Encounter (Signed)
NPSG/MSLT- [facility] no Josem Kaufmann req spoke to Yvone Neu ref # [id number]  Patient is scheduled at [facility] for 07/03/22 at 8 pm.  Mailed packet to the patient and mychart.

## 2022-05-13 NOTE — Progress Notes (Unsigned)
   Acute Office Visit  Subjective:     Patient ID: Kelly Haynes, female    DOB: 1990/11/20, 31 y.o.   MRN: [id number]  No chief complaint on file.   HPI Patient is in today for wart removal  ROS      Objective:    There were no vitals taken for this visit. {Vitals History (Optional):23777}  Physical Exam  No results found for any visits on 05/14/22.      Assessment & Plan:   Problem List Items Addressed This Visit   None   No orders of the defined types were placed in this encounter.   No follow-ups on file.  Owens Loffler, DO

## 2022-05-14 ENCOUNTER — Ambulatory Visit: Payer: No Typology Code available for payment source | Admitting: Professional

## 2022-05-14 ENCOUNTER — Ambulatory Visit (INDEPENDENT_AMBULATORY_CARE_PROVIDER_SITE_OTHER): Payer: Self-pay | Admitting: Family Medicine

## 2022-05-14 ENCOUNTER — Telehealth: Payer: Self-pay | Admitting: Family Medicine

## 2022-05-14 DIAGNOSIS — B070 Plantar wart: Secondary | ICD-10-CM

## 2022-05-14 DIAGNOSIS — Z91199 Patient's noncompliance with other medical treatment and regimen due to unspecified reason: Secondary | ICD-10-CM

## 2022-05-14 NOTE — Telephone Encounter (Signed)
Patient came in at 9:10 am today, she stated her appointment was scheduled online and that she didn't realize it until it was too late and she rescheduled for 05/21/22. Is this okay to remove her appointment to avoid a no show fee? AMUCK

## 2022-05-20 ENCOUNTER — Other Ambulatory Visit (HOSPITAL_COMMUNITY): Payer: Self-pay

## 2022-05-21 ENCOUNTER — Other Ambulatory Visit (HOSPITAL_COMMUNITY): Payer: Self-pay

## 2022-05-21 ENCOUNTER — Ambulatory Visit (INDEPENDENT_AMBULATORY_CARE_PROVIDER_SITE_OTHER): Payer: No Typology Code available for payment source | Admitting: Family Medicine

## 2022-05-21 VITALS — BP 115/83 | HR 89 | Ht 66.0 in | Wt 140.0 lb

## 2022-05-21 DIAGNOSIS — B079 Viral wart, unspecified: Secondary | ICD-10-CM

## 2022-05-21 MED ORDER — AMPHETAMINE-DEXTROAMPHET ER 20 MG PO CP24: 20.0000 mg | ORAL_CAPSULE | Freq: Two times a day (BID) | ORAL | 0 refills | Status: AC

## 2022-05-21 NOTE — Assessment & Plan Note (Signed)
-   Pathology shows verruca vulgaris - removal of two warts done using cryotherapy   - pt tolerated procedure well  - given follow up instructions and if does not scale off in a few weeks to return for repeat treatment

## 2022-05-21 NOTE — Progress Notes (Signed)
   Acute Office Visit  Subjective:     Patient ID: Kelly Haynes, female    DOB: 13-Mar-1991, 31 y.o.   MRN: [id number]  No chief complaint on file.   HPI Patient is in today for wart removal. Biospy was done at her last office visit which indicated verruca vulgaris. Wart is located on her leg and she is here today for removal via cryotherapy.       Objective:    BP 115/83   Pulse 89   Ht '5\' 6"'$  (1.676 m)   Wt 140 lb (63.5 kg)   SpO2 99%   BMI 22.60 kg/m   Procedure was explained in great detail and risks such as infection, bleeding, and scarring were addressed with patient. Pt verbally consented to treatment. Cryotherapy was used and removal of wartdone.  No results found for any visits on 05/21/22.      Assessment & Plan:   Problem List Items Addressed This Visit       Musculoskeletal and Integument   Verruca vulgaris of lower extremity - Primary    - Pathology shows verruca vulgaris - removal of two warts done using cryotherapy   - pt tolerated procedure well  - given follow up instructions and if does not scale off in a few weeks to return for repeat treatment       No orders of the defined types were placed in this encounter.   No follow-ups on file.  Owens Loffler, DO

## 2022-06-11 ENCOUNTER — Ambulatory Visit (INDEPENDENT_AMBULATORY_CARE_PROVIDER_SITE_OTHER): Payer: No Typology Code available for payment source | Admitting: Professional

## 2022-06-11 ENCOUNTER — Encounter: Payer: Self-pay | Admitting: Professional

## 2022-06-11 DIAGNOSIS — F900 Attention-deficit hyperactivity disorder, predominantly inattentive type: Secondary | ICD-10-CM | POA: Diagnosis not present

## 2022-06-11 DIAGNOSIS — F331 Major depressive disorder, recurrent, moderate: Secondary | ICD-10-CM

## 2022-06-11 DIAGNOSIS — F411 Generalized anxiety disorder: Secondary | ICD-10-CM | POA: Diagnosis not present

## 2022-06-11 NOTE — Progress Notes (Signed)
[facility] Counselor/Therapist Progress Note  Patient ID: Kelly Haynes, MRN: [id number],    Date: 06/11/2022  Time Spent: 55 minutes 10-1055am   Treatment Type: Individual Therapy  Risk Assessment: Danger to Self:  No Self-injurious Behavior: No Danger to Others: No  Subjective: This session was held via video teletherapy. The patient consented to video teletherapy and was located at her home during this session. She is aware it is the responsibility of the patient to secure confidentiality on her end of the session. The provider was in a private home office for the duration of this session.   The patient arrived on time for her webex session.  Issues addressed: 1-[employer] school -has been studying for the GRE -has done six hours shadowing   -schools like to see twenty hours of shadowing prior to application   -she is planning to do several more observations 2-professional -one of her patients ended up in the ICU one day after discharge   -the ED physician told the family and the surgeon that the PACU nurse sent her home with oxygen at 70 -pt bothered that she puts so much for people she respects   -she doesn't want them to question if she knows what she is doing -pt wants to use this as a learning experience  Treatment Plan Problems Addressed  Anxiety, Grief / Loss Unresolved, Intimate Relationship Conflicts, Parenting  Goals 1. Achieve a greater level of family connectedness. 2. Achieve a level of competent, effective parenting. 3. Begin a healthy grieving process around the loss. 4. Complete the process of letting go of the lost significant other. 5. Develop the necessary skills for effective, open communication, mutually satisfying sexual intimacy, and enjoyable time for companionship within the relationship. 6. Enhance ability to effectively cope with the full variety of life's worries and anxieties. 7. Increase awareness of own role in the relationship  conflicts. 8. Learn and implement coping skills that result in a reduction of anxiety and worry, and improved daily functioning. 9. New Goal Statement for Anxiety Objective Describe situations, thoughts, feelings, and actions associated with anxieties and worries, their impact on functioning, and attempts to resolve them. Target Date: 2022-10-29 Frequency: Biweekly  Progress: 60 Modality: individual  Related Interventions Ask the client to describe his/her past experiences of anxiety and their impact on functioning; assess the focus, excessiveness, and uncontrollability of the worry and the type, frequency, intensity, and duration of his/her anxiety symptoms (consider using a structured interview such as The Anxiety Disorders Interview Schedule-Adult Version). Objective Verbalize an understanding of the cognitive, physiological, and behavioral components of anxiety and its treatment. Target Date: 2022-10-29 Frequency: Biweekly  Progress: 60 Modality: individual  Related Interventions Discuss how treatment targets worry, anxiety symptoms, and avoidance to help the client manage worry effectively, reduce overarousal, and eliminate unnecessary avoidance. Objective Learn and implement calming skills to reduce overall anxiety and manage anxiety symptoms. Target Date: 2022-10-29 Frequency: Biweekly  Progress: 20 Modality: individual  Related Interventions Teach the client calming/relaxation skills (e.g., applied relaxation, progressive muscle relaxation, cue controlled relaxation; mindful breathing; biofeedback) and how to discriminate better between relaxation and tension; teach the client how to apply these skills to his/her daily life (e.g., New Directions in Progressive Muscle Relaxation by Casper Harrison, and Hazlett-Stevens; Treating Generalized Anxiety Disorder by Rygh and Amparo Bristol). Assign the client homework each session in which he/she practices relaxation exercises daily, gradually  applying them progressively from non-anxiety-provoking to anxiety-provoking situations; review and reinforce success while providing corrective feedback toward improvement.  Objective Identify, challenge, and replace biased, fearful self-talk with positive, realistic, and empowering self-talk. Target Date: 2022-10-29 Frequency: Biweekly  Progress: 40 Modality: individual  Related Interventions Explore the client's schema and self-talk that mediate his/her fear response; assist him/her in challenging the biases; replace the distorted messages with reality-based alternatives and positive, realistic self-talk that will increase his/her self-confidence in coping with irrational fears (see Cognitive Therapy of Anxiety Disorders by Alison Stalling). Objective Learn and implement relapse prevention strategies for managing possible future anxiety symptoms. Target Date: 2022-10-29 Frequency: Biweekly  Progress: 20 Modality: individual  Objective Learn and implement problem-solving strategies for realistically addressing worries. Target Date: 2022-10-29 Frequency: Biweekly  Progress: 20 Modality: individual  Related Interventions Teach the client problem-solving strategies involving specifically defining a problem, generating options for addressing it, evaluating the pros and cons of each option, selecting and implementing an optional action, and reevaluating and refining the action (or assign "Applying Problem-Solving to Interpersonal Conflict" in the Adult Psychotherapy Homework Planner by Bryn Gulling). Assign the client a homework exercise in which he/she problem-solves a current problem (see Mastery of Your Anxiety and Worry: Workbook by Adora Fridge and Eliot Ford or Generalized Anxiety Disorder by Eather Colas, and Eliot Ford); review, reinforce success, and provide corrective feedback toward improvement. Objective Undergo gradual repeated imaginal exposure to the feared negative consequences predicted by worries and  develop alternative reality-based predictions. Target Date: 2022-10-29 Frequency: Biweekly  Progress: 20 Modality: individual  10. Grief / Loss Unresolved Objective Begin verbalizing feelings associated with the loss. (of relationship with Linna Hoff, potentially no sibling for [facility]) Target Date: 2022-10-29 Frequency: Biweekly  Progress: 70 Modality: individual  Objective Tell in detail the story of the current loss that is triggering symptoms. Target Date: 2022-10-29 Frequency: Biweekly  Progress: 50 Modality: individual  Objective Verbalize resolution of feelings of guilt and regret associated with the loss. Target Date: 2022-10-29 Frequency: Biweekly  Progress: 80 Modality: individual  Objective Implement acts of spiritual faith as a source of comfort and hope. Target Date: 2022-10-29 Frequency: Biweekly  Progress: 50 Modality: individual  11. Intimate Relationship Conflicts Objective Learn and implement problem-solving and conflict resolution skills. Target Date: 2022-10-29 Frequency: Biweekly  Progress: 70 Modality: individual  Objective Increase flexibility of expectations, willingness to compromise, and acceptance of irreconcilable differences. Target Date: 2022-10-29 Frequency: Biweekly  Progress: 80 Modality: individual  12. Patient will have reasonable, healthy expectations regarding a balance between self-care and daughter's needs. Objective Learn and implement parenting practices that have demonstrated effectiveness. Target Date: 2022-10-29 Frequency: Biweekly  Progress: 80 Modality: individual  Objective Verbalize a sense of increased skill, effectiveness, and confidence in parenting. Target Date: 2022-10-29 Frequency: Biweekly  Progress: 80 Modality: individual  Objective Freely express feelings of frustration, helplessness, and inadequacy that each experiences in the parenting role. Linna Hoff) Target Date: 2022-10-29 Frequency: Biweekly  Progress: 50 Modality: individual   Objective Identify unresolved childhood issues that affect parenting and work toward their resolution. Target Date: 2022-10-29 Frequency: Biweekly  Progress: 80 Modality: individual  Objective Decrease outside pressures, demands, and distractions that drain energy and time from the family. Target Date: 2022-10-29 Frequency: Biweekly  Progress: 80 Modality: individual  13. Reach a realistic view and approach to parenting, given the child's developmental level. 14. Reduce overall frequency, intensity, and duration of the anxiety so that daily functioning is not impaired. 15. Resolve the core conflict that is the source of anxiety. 16. Stabilize anxiety level while increasing ability to function on a daily basis.  Diagnosis:Generalized anxiety disorder  Major depressive  disorder, recurrent episode, moderate ([place])  Attention deficit hyperactivity disorder (ADHD), predominantly inattentive type  Plan:  -meet again on Tuesday, June 25, 2022 at [place], [facility]

## 2022-06-23 NOTE — Progress Notes (Deleted)
ANNUAL EXAM Patient name: Kelly Haynes MRN [id number]  Date of birth: Feb 21, 1991 Chief Complaint:   No chief complaint on file.  History of Present Illness:   Kelly Haynes is a 31 y.o. G1P1 female being seen today for a routine annual exam.   Current complaints: ***  No LMP recorded. (Menstrual status: IUD).  Current birth control: IUD  Last pap:  s/p GARDASIL 2012: normal pap 2014: LSIL 2016, 2019: Normal pap 2021: LSIL, HPV pos 04/2020: Colpo with CIN1 05/2020: LEEP - CIN2 with positive margins 11/2020: normal pap, HPV pos 05/2021: LSIL/HPV pos, ECC with low grade dysplasia 07/2022: Colpo w/Bx neg, ECC with low grade dysplasia  Health Maintenance Due  Topic Date Due   COVID-19 Vaccine (5 - Pfizer series) 08/18/2021   INFLUENZA VACCINE  04/23/2022    Review of Systems:   Pertinent items are noted in HPI Denies any headaches, blurred vision, fatigue, shortness of breath, chest pain, abdominal pain, abnormal vaginal discharge/itching/odor/irritation, problems with periods, bowel movements, urination, or intercourse unless otherwise stated above. *** Pertinent History Reviewed:  Reviewed past medical,surgical, social and family history.  Reviewed problem list, medications and allergies. Physical Assessment:  There were no vitals filed for this visit.There is no height or weight on file to calculate BMI.   Physical Examination:  General appearance - well appearing, and in no distress Mental status - alert, oriented to person, place, and time Psych:  She has a normal mood and affect Skin - warm and dry, normal color, no suspicious lesions noted Chest - effort normal Heart - normal rate  Breasts - breasts appear normal, no suspicious masses, no skin or nipple changes or axillary nodes Abdomen - soft, nontender, nondistended, no masses or organomegaly Pelvic -  VULVA: normal appearing vulva with no masses, tenderness or lesions  VAGINA: normal appearing vagina with normal  color and discharge, no lesions  CERVIX: normal appearing cervix without discharge or lesions, no CMT, IUD strings not visible (cut with leep) UTERUS: uterus is felt to be normal size, shape, consistency and nontender  ADNEXA: No adnexal masses or tenderness noted. Extremities:  No swelling or varicosities noted  Chaperone present for exam  No results found for this or any previous visit (from the past 24 hour(s)).   GYNECOLOGY OFFICE COLPOSCOPY PROCEDURE NOTE  31 y.o. G1P1 here for colposcopy for {Findings; lab pap smear results:16707::"no abnormalities"} pap smear on ***.   Pregnancy test:  {Blank single:19197::"positive","negative","not indicated"} Gardasil:  {Blank single:19197::"completed","not yet had, declines","not yet had, accepts"}. Discussed role for HPV in cervical dysplasia, need for surveillance.  Informed consent and review of risks, benefit and alternatives performed. Written consent given.   Speculum inserted into patient's vagina assuring full view of cervix and vaginal walls. 3 swabs of vinegar solution applied to the cervix and vaginal walls and colposcope was used to observe both the cervix and vaginal walls.   Colposcopy adequate? {yes/no:20286}  {Findings; colposcopy:728}; corresponding biopsies obtained.  ECC collected.  All specimens were labeled and sent to pathology.  Monsel's applied to biopsy sites for good hemostasis and speculum removed.  Pt tolerated well with minimal pain and bleeding.  Assessment & Plan:  Diagnoses and all orders for this visit:  Encounter for annual routine gynecological examination  History of abnormal cervical Pap smear  - Cervical cancer screening: Discussed guidelines. Pap with HPV done - Gardasil: completed - GC/CT: {Blank single:19197::"accepts","declines","not indicated"} - Birth Control: IUD - Breast Health: Encouraged self breast awareness/SBE. Teaching provided.  - F/U  12 months and prn   Patient was given post  procedure instructions.  Will follow up pathology and manage accordingly; patient will be contacted with results and recommendations.  Routine preventative health maintenance measures emphasized.   No orders of the defined types were placed in this encounter.   Meds: No orders of the defined types were placed in this encounter.   Follow-up: No follow-ups on file.  Radene Gunning, MD 06/23/2022 9:49 PM

## 2022-06-25 ENCOUNTER — Encounter: Payer: Self-pay | Admitting: Professional

## 2022-06-25 ENCOUNTER — Ambulatory Visit (INDEPENDENT_AMBULATORY_CARE_PROVIDER_SITE_OTHER): Payer: No Typology Code available for payment source | Admitting: Professional

## 2022-06-25 DIAGNOSIS — F900 Attention-deficit hyperactivity disorder, predominantly inattentive type: Secondary | ICD-10-CM | POA: Diagnosis not present

## 2022-06-25 DIAGNOSIS — F411 Generalized anxiety disorder: Secondary | ICD-10-CM

## 2022-06-25 DIAGNOSIS — F331 Major depressive disorder, recurrent, moderate: Secondary | ICD-10-CM

## 2022-06-25 NOTE — Progress Notes (Signed)
[place] Counselor/Therapist Progress Note  Patient ID: Kelly Haynes, MRN: [id number],    Date: 06/25/2022  Time Spent: 61 minutes 3-401pm   Treatment Type: Individual Therapy  Risk Assessment: Danger to Self:  No Self-injurious Behavior: No Danger to Others: No  Subjective: This session was held via video teletherapy. The patient consented to video teletherapy and was located at her home during this session. She is aware it is the responsibility of the patient to secure confidentiality on her end of the session. The provider was in a private home office for the duration of this session.   The patient arrived on time for her webex session.  Issues addressed: 1-self-esteem issues a-thinks about her parents divorce and the lack of emotional availability b-relationships with guys that she sees  future and they don't workout -first boyfriend broke up with her and did not explain why c-first identified confidence issues in HS -felt inferior to sister in HS   -her sister went to early college and she was popular   -her sister was accepted to [employer] and she was jealous     -pt admits not even applying because she was unsure she would be accepted d-bell curve related to where she fits -sees herself as above average -why does she doubt herself -pt stigmatizes herself for having been pregnant and not married   -how to release guilt and shame she associates with being an unwed mother 2-OCD -pt discussed previous dx of OCD -as precaution, completed a brief screening and pt did not meet critieria  Treatment Plan Problems Addressed  Anxiety, Grief / Loss Unresolved, Intimate Relationship Conflicts, Parenting  Goals 1. Achieve a greater level of family connectedness. 2. Achieve a level of competent, effective parenting. 3. Begin a healthy grieving process around the loss. 4. Complete the process of letting go of the lost significant other. 5. Develop the necessary  skills for effective, open communication, mutually satisfying sexual intimacy, and enjoyable time for companionship within the relationship. 6. Enhance ability to effectively cope with the full variety of life's worries and anxieties. 7. Increase awareness of own role in the relationship conflicts. 8. Learn and implement coping skills that result in a reduction of anxiety and worry, and improved daily functioning. 9. New Goal Statement for Anxiety Objective Describe situations, thoughts, feelings, and actions associated with anxieties and worries, their impact on functioning, and attempts to resolve them. Target Date: 2022-10-29 Frequency: Biweekly  Progress: 60 Modality: individual  Related Interventions Ask the client to describe his/her past experiences of anxiety and their impact on functioning; assess the focus, excessiveness, and uncontrollability of the worry and the type, frequency, intensity, and duration of his/her anxiety symptoms (consider using a structured interview such as The Anxiety Disorders Interview Schedule-Adult Version). Objective Verbalize an understanding of the cognitive, physiological, and behavioral components of anxiety and its treatment. Target Date: 2022-10-29 Frequency: Biweekly  Progress: 60 Modality: individual  Related Interventions Discuss how treatment targets worry, anxiety symptoms, and avoidance to help the client manage worry effectively, reduce overarousal, and eliminate unnecessary avoidance. Objective Learn and implement calming skills to reduce overall anxiety and manage anxiety symptoms. Target Date: 2022-10-29 Frequency: Biweekly  Progress: 20 Modality: individual  Related Interventions Teach the client calming/relaxation skills (e.g., applied relaxation, progressive muscle relaxation, cue controlled relaxation; mindful breathing; biofeedback) and how to discriminate better between relaxation and tension; teach the client how to apply these skills  to his/her daily life (e.g., New Directions in Progressive Muscle Relaxation by  Casper Harrison, and Hazlett-Stevens; Treating Generalized Anxiety Disorder by Rygh and Amparo Bristol). Assign the client homework each session in which he/she practices relaxation exercises daily, gradually applying them progressively from non-anxiety-provoking to anxiety-provoking situations; review and reinforce success while providing corrective feedback toward improvement. Objective Identify, challenge, and replace biased, fearful self-talk with positive, realistic, and empowering self-talk. Target Date: 2022-10-29 Frequency: Biweekly  Progress: 40 Modality: individual  Related Interventions Explore the client's schema and self-talk that mediate his/her fear response; assist him/her in challenging the biases; replace the distorted messages with reality-based alternatives and positive, realistic self-talk that will increase his/her self-confidence in coping with irrational fears (see Cognitive Therapy of Anxiety Disorders by Alison Stalling). Objective Learn and implement relapse prevention strategies for managing possible future anxiety symptoms. Target Date: 2022-10-29 Frequency: Biweekly  Progress: 20 Modality: individual  Objective Learn and implement problem-solving strategies for realistically addressing worries. Target Date: 2022-10-29 Frequency: Biweekly  Progress: 20 Modality: individual  Related Interventions Teach the client problem-solving strategies involving specifically defining a problem, generating options for addressing it, evaluating the pros and cons of each option, selecting and implementing an optional action, and reevaluating and refining the action (or assign "Applying Problem-Solving to Interpersonal Conflict" in the Adult Psychotherapy Homework Planner by Bryn Gulling). Assign the client a homework exercise in which he/she problem-solves a current problem (see Mastery of Your Anxiety and Worry:  Workbook by Adora Fridge and Eliot Ford or Generalized Anxiety Disorder by Eather Colas, and Eliot Ford); review, reinforce success, and provide corrective feedback toward improvement. Objective Undergo gradual repeated imaginal exposure to the feared negative consequences predicted by worries and develop alternative reality-based predictions. Target Date: 2022-10-29 Frequency: Biweekly  Progress: 20 Modality: individual  10. Grief / Loss Unresolved Objective Begin verbalizing feelings associated with the loss. (of relationship with Linna Hoff, potentially no sibling for [facility]) Target Date: 2022-10-29 Frequency: Biweekly  Progress: 70 Modality: individual  Objective Tell in detail the story of the current loss that is triggering symptoms. Target Date: 2022-10-29 Frequency: Biweekly  Progress: 50 Modality: individual  Objective Verbalize resolution of feelings of guilt and regret associated with the loss. Target Date: 2022-10-29 Frequency: Biweekly  Progress: 80 Modality: individual  Objective Implement acts of spiritual faith as a source of comfort and hope. Target Date: 2022-10-29 Frequency: Biweekly  Progress: 50 Modality: individual  11. Intimate Relationship Conflicts Objective Learn and implement problem-solving and conflict resolution skills. Target Date: 2022-10-29 Frequency: Biweekly  Progress: 70 Modality: individual  Objective Increase flexibility of expectations, willingness to compromise, and acceptance of irreconcilable differences. Target Date: 2022-10-29 Frequency: Biweekly  Progress: 80 Modality: individual  12. Patient will have reasonable, healthy expectations regarding a balance between self-care and daughter's needs. Objective Learn and implement parenting practices that have demonstrated effectiveness. Target Date: 2022-10-29 Frequency: Biweekly  Progress: 80 Modality: individual  Objective Verbalize a sense of increased skill, effectiveness, and confidence in  parenting. Target Date: 2022-10-29 Frequency: Biweekly  Progress: 80 Modality: individual  Objective Freely express feelings of frustration, helplessness, and inadequacy that each experiences in the parenting role. Linna Hoff) Target Date: 2022-10-29 Frequency: Biweekly  Progress: 50 Modality: individual  Objective Identify unresolved childhood issues that affect parenting and work toward their resolution. Target Date: 2022-10-29 Frequency: Biweekly  Progress: 80 Modality: individual  Objective Decrease outside pressures, demands, and distractions that drain energy and time from the family. Target Date: 2022-10-29 Frequency: Biweekly  Progress: 80 Modality: individual  13. Reach a realistic view and approach to parenting, given the child's developmental level. 14. Reduce overall frequency,  intensity, and duration of the anxiety so that daily functioning is not impaired. 15. Resolve the core conflict that is the source of anxiety. 16. Stabilize anxiety level while increasing ability to function on a daily basis.  Diagnosis:Generalized anxiety disorder  Major depressive disorder, recurrent episode, moderate (HCC)  Attention deficit hyperactivity disorder (ADHD), predominantly inattentive type  Plan:  -meet again on Tuesday, July 09, 2022 at [place], [facility]

## 2022-06-27 ENCOUNTER — Ambulatory Visit: Payer: No Typology Code available for payment source | Admitting: Obstetrics and Gynecology

## 2022-06-27 DIAGNOSIS — Z01419 Encounter for gynecological examination (general) (routine) without abnormal findings: Secondary | ICD-10-CM

## 2022-06-27 DIAGNOSIS — Z8742 Personal history of other diseases of the female genital tract: Secondary | ICD-10-CM

## 2022-07-03 ENCOUNTER — Ambulatory Visit (INDEPENDENT_AMBULATORY_CARE_PROVIDER_SITE_OTHER): Payer: No Typology Code available for payment source | Admitting: Neurology

## 2022-07-03 DIAGNOSIS — R6889 Other general symptoms and signs: Secondary | ICD-10-CM

## 2022-07-03 DIAGNOSIS — R400 Somnolence: Secondary | ICD-10-CM

## 2022-07-03 DIAGNOSIS — G4719 Other hypersomnia: Secondary | ICD-10-CM

## 2022-07-03 DIAGNOSIS — G4720 Circadian rhythm sleep disorder, unspecified type: Secondary | ICD-10-CM

## 2022-07-03 DIAGNOSIS — G478 Other sleep disorders: Secondary | ICD-10-CM

## 2022-07-03 DIAGNOSIS — G4710 Hypersomnia, unspecified: Secondary | ICD-10-CM | POA: Diagnosis not present

## 2022-07-04 ENCOUNTER — Other Ambulatory Visit: Payer: Self-pay | Admitting: Neurology

## 2022-07-04 ENCOUNTER — Ambulatory Visit: Payer: No Typology Code available for payment source | Admitting: Neurology

## 2022-07-04 ENCOUNTER — Other Ambulatory Visit: Payer: No Typology Code available for payment source

## 2022-07-04 DIAGNOSIS — R6889 Other general symptoms and signs: Secondary | ICD-10-CM

## 2022-07-04 DIAGNOSIS — R400 Somnolence: Secondary | ICD-10-CM

## 2022-07-04 DIAGNOSIS — G478 Other sleep disorders: Secondary | ICD-10-CM

## 2022-07-04 DIAGNOSIS — G4710 Hypersomnia, unspecified: Secondary | ICD-10-CM

## 2022-07-04 DIAGNOSIS — Z79899 Other long term (current) drug therapy: Secondary | ICD-10-CM

## 2022-07-04 DIAGNOSIS — Z0289 Encounter for other administrative examinations: Secondary | ICD-10-CM

## 2022-07-08 LAB — COMPREHENSIVE DRUG ANALYSIS,UR

## 2022-07-09 ENCOUNTER — Encounter: Payer: Self-pay | Admitting: Professional

## 2022-07-09 ENCOUNTER — Ambulatory Visit (INDEPENDENT_AMBULATORY_CARE_PROVIDER_SITE_OTHER): Payer: No Typology Code available for payment source | Admitting: Professional

## 2022-07-09 DIAGNOSIS — F900 Attention-deficit hyperactivity disorder, predominantly inattentive type: Secondary | ICD-10-CM

## 2022-07-09 DIAGNOSIS — F411 Generalized anxiety disorder: Secondary | ICD-10-CM

## 2022-07-09 DIAGNOSIS — F331 Major depressive disorder, recurrent, moderate: Secondary | ICD-10-CM

## 2022-07-09 NOTE — Progress Notes (Signed)
[place] Counselor/Therapist Progress Note  Patient ID: Kelly Haynes, MRN: [id number],    Date: 07/09/2022  Time Spent: 51 minutes [id number]   Treatment Type: Individual Therapy  Risk Assessment: Danger to Self:  No Self-injurious Behavior: No Danger to Others: No  Subjective: This session was held via video teletherapy. The patient consented to video teletherapy and was located at her home during this session. She is aware it is the responsibility of the patient to secure confidentiality on her end of the session. The provider was in a private home office for the duration of this session.   The patient arrived on time for her webex session.  Issues addressed: 1-physical -had her sleep study with 5 nap times   -had to complete the  2-self-esteem issues a-pt feels better about herald after last week's discussion -pt admits she has to remind herself that she  b-pt has been more aware of her importance   Treatment Plan Problems Addressed  Anxiety, Grief / Loss Unresolved, Intimate Relationship Conflicts, Parenting  Goals 1. Achieve a greater level of family connectedness. 2. Achieve a level of competent, effective parenting. 3. Begin a healthy grieving process around the loss. 4. Complete the process of letting go of the lost significant other. 5. Develop the necessary skills for effective, open communication, mutually satisfying sexual intimacy, and enjoyable time for companionship within the relationship. 6. Enhance ability to effectively cope with the full variety of life's worries and anxieties. 7. Increase awareness of own role in the relationship conflicts. 8. Learn and implement coping skills that result in a reduction of anxiety and worry, and improved daily functioning. 9. New Goal Statement for Anxiety Objective Describe situations, thoughts, feelings, and actions associated with anxieties and worries, their impact on functioning, and attempts to  resolve them. Target Date: 2022-10-29 Frequency: Biweekly  Progress: 60 Modality: individual  Related Interventions Ask the client to describe his/her past experiences of anxiety and their impact on functioning; assess the focus, excessiveness, and uncontrollability of the worry and the type, frequency, intensity, and duration of his/her anxiety symptoms (consider using a structured interview such as The Anxiety Disorders Interview Schedule-Adult Version). Objective Verbalize an understanding of the cognitive, physiological, and behavioral components of anxiety and its treatment. Target Date: 2022-10-29 Frequency: Biweekly  Progress: 60 Modality: individual  Related Interventions Discuss how treatment targets worry, anxiety symptoms, and avoidance to help the client manage worry effectively, reduce overarousal, and eliminate unnecessary avoidance. Objective Learn and implement calming skills to reduce overall anxiety and manage anxiety symptoms. Target Date: 2022-10-29 Frequency: Biweekly  Progress: 20 Modality: individual  Related Interventions Teach the client calming/relaxation skills (e.g., applied relaxation, progressive muscle relaxation, cue controlled relaxation; mindful breathing; biofeedback) and how to discriminate better between relaxation and tension; teach the client how to apply these skills to his/her daily life (e.g., New Directions in Progressive Muscle Relaxation by Casper Harrison, and Hazlett-Stevens; Treating Generalized Anxiety Disorder by Rygh and Amparo Bristol). Assign the client homework each session in which he/she practices relaxation exercises daily, gradually applying them progressively from non-anxiety-provoking to anxiety-provoking situations; review and reinforce success while providing corrective feedback toward improvement. Objective Identify, challenge, and replace biased, fearful self-talk with positive, realistic, and empowering self-talk. Target Date:  2022-10-29 Frequency: Biweekly  Progress: 40 Modality: individual  Related Interventions Explore the client's schema and self-talk that mediate his/her fear response; assist him/her in challenging the biases; replace the distorted messages with reality-based alternatives and positive, realistic self-talk that will increase his/her self-confidence in  coping with irrational fears (see Cognitive Therapy of Anxiety Disorders by Alison Stalling). Objective Learn and implement relapse prevention strategies for managing possible future anxiety symptoms. Target Date: 2022-10-29 Frequency: Biweekly  Progress: 20 Modality: individual  Objective Learn and implement problem-solving strategies for realistically addressing worries. Target Date: 2022-10-29 Frequency: Biweekly  Progress: 20 Modality: individual  Related Interventions Teach the client problem-solving strategies involving specifically defining a problem, generating options for addressing it, evaluating the pros and cons of each option, selecting and implementing an optional action, and reevaluating and refining the action (or assign "Applying Problem-Solving to Interpersonal Conflict" in the Adult Psychotherapy Homework Planner by Bryn Gulling). Assign the client a homework exercise in which he/she problem-solves a current problem (see Mastery of Your Anxiety and Worry: Workbook by Adora Fridge and Eliot Ford or Generalized Anxiety Disorder by Eather Colas, and Eliot Ford); review, reinforce success, and provide corrective feedback toward improvement. Objective Undergo gradual repeated imaginal exposure to the feared negative consequences predicted by worries and develop alternative reality-based predictions. Target Date: 2022-10-29 Frequency: Biweekly  Progress: 20 Modality: individual  10. Grief / Loss Unresolved Objective Begin verbalizing feelings associated with the loss. (of relationship with Linna Hoff, potentially no sibling for [facility]) Target Date: 2022-10-29  Frequency: Biweekly  Progress: 70 Modality: individual  Objective Tell in detail the story of the current loss that is triggering symptoms. Target Date: 2022-10-29 Frequency: Biweekly  Progress: 50 Modality: individual  Objective Verbalize resolution of feelings of guilt and regret associated with the loss. Target Date: 2022-10-29 Frequency: Biweekly  Progress: 80 Modality: individual  Objective Implement acts of spiritual faith as a source of comfort and hope. Target Date: 2022-10-29 Frequency: Biweekly  Progress: 50 Modality: individual  11. Intimate Relationship Conflicts Objective Learn and implement problem-solving and conflict resolution skills. Target Date: 2022-10-29 Frequency: Biweekly  Progress: 70 Modality: individual  Objective Increase flexibility of expectations, willingness to compromise, and acceptance of irreconcilable differences. Target Date: 2022-10-29 Frequency: Biweekly  Progress: 80 Modality: individual  12. Patient will have reasonable, healthy expectations regarding a balance between self-care and daughter's needs. Objective Learn and implement parenting practices that have demonstrated effectiveness. Target Date: 2022-10-29 Frequency: Biweekly  Progress: 80 Modality: individual  Objective Verbalize a sense of increased skill, effectiveness, and confidence in parenting. Target Date: 2022-10-29 Frequency: Biweekly  Progress: 80 Modality: individual  Objective Freely express feelings of frustration, helplessness, and inadequacy that each experiences in the parenting role.  Target Date: 2022-10-29 Frequency: Biweekly  Progress: 50 Modality: individual  Objective Identify unresolved childhood issues that affect parenting and work toward their resolution. Target Date: 2022-10-29 Frequency: Biweekly  Progress: 80 Modality: individual  Objective Decrease outside pressures, demands, and distractions that drain energy and time from the family. Target Date:  2022-10-29 Frequency: Biweekly  Progress: 80 Modality: individual  13. Reach a realistic view and approach to parenting, given the child's developmental level. 14. Reduce overall frequency, intensity, and duration of the anxiety so that daily functioning is not impaired. 15. Resolve the core conflict that is the source of anxiety. 16. Stabilize anxiety level while increasing ability to function on a daily basis.  Diagnosis:Generalized anxiety disorder  Major depressive disorder, recurrent episode, moderate (HCC)  Attention deficit hyperactivity disorder (ADHD), predominantly inattentive type  Plan:  -meet again on Tuesday, July 30, 2022 at [place], [facility]

## 2022-07-15 ENCOUNTER — Other Ambulatory Visit (HOSPITAL_COMMUNITY): Payer: Self-pay

## 2022-07-15 MED ORDER — AMPHETAMINE-DEXTROAMPHET ER 20 MG PO CP24
20.0000 mg | ORAL_CAPSULE | Freq: Two times a day (BID) | ORAL | 0 refills | Status: AC
  Filled 2022-07-15: qty 60, 30d supply, fill #0

## 2022-07-15 NOTE — Procedures (Signed)
Physician Interpretation:     [facility] POLYSOMNOGRAPHY  INTERPRETATION REPORT   STUDY DATE:  07/03/2022     PATIENT NAME:  Kelly Haynes         DATE OF BIRTH:  Feb 08, 1991  PATIENT ID:  [id number]    TYPE OF STUDY:  MSLT  READING PHYSICIAN: Star Age, MD, PhD REFERRED BY: Samuel Bouche, NP SCORING TECHNICIAN: Barrie Lyme, RPSGT   HISTORY:  31 year old right-handed woman with an underlying medical history of ADD or ADHD, depression, anxiety, and reflux disease, who reports a longstanding history of excessive daytime somnolence. Her Epworth sleepiness score is 18 out of 24, fatigue severity score is 58 out of 63. She tapered off Prozac and Adderall for this PSG with next day MSLT. A UDS on 07/04/2022 was negative for any psychotropic medications, positive for Tylenol which the patient took for headache. Height: 66 in Weight: 148 lb (BMI 23) Neck Size: 13 in  MEDICATIONS: Adderall XR, Vitamin D, Prozac, Mirena, Mobic, fish oil, Turmeric-Ginger TECHNICAL DESCRIPTION: A registered sleep technologist was in attendance for the duration of the recording.  Data collection, scoring, video monitoring, and reporting were performed in compliance with the AASM Manual for the Scoring of Sleep and Associated Events; (Hypopnea is scored based on the criteria listed in Section VIII D. 1b in the AASM Manual V2.6 using a 4% oxygen desaturation rule or Hypopnea is scored based on the criteria listed in Section VIII D. 1a in the AASM Manual V2.6 using 3% oxygen desaturation and /or arousal rule).   SLEEP CONTINUITY AND SLEEP ARCHITECTURE:  Lights-out was at 22:21: and lights-on at  06:27:, with a total recording time of 8 hours, 6 min. Total sleep time ( TST) was 427.5 minutes with a normal sleep efficiency at 88.0%.  BODY POSITION:  TST was divided  between the following sleep positions: 51.5% supine;  33.0% lateral;  16% prone. Duration of total sleep and percent of total sleep  in their respective position is as follows: supine 220 minutes (51%), non-supine 208 minutes (49%); right 109 minutes (25%), left 32 minutes (7%), and prone 66 minutes (16%).  Total supine REM sleep time was 42 minutes (35% of total REM sleep).  Sleep latency was normal at 24.0 minutes.  REM sleep latency was mildly reduced at 60.0 minutes. Of the total sleep time, the percentage of stage N1 sleep was 2.7%, stage N2 sleep was 47%, which is normal, stage N3 sleep was 22.3%, and REM sleep was 27.7%, which is slightly increased. Wake after sleep onset (WASO) time accounted for 34.5 minutes with minimal sleep fragmentation noted.  RESPIRATORY MONITORING:  Based on CMS criteria (using a 4% oxygen desaturation rule for scoring hypopneas), there were 4 apneas (0 obstructive; 4 central; 0 mixed), and 5 hypopneas.  Apnea index was 0.6. Hypopnea index was 0.7. The apnea-hypopnea index was 1.3 overall (0.8 supine, 4 non-supine; 4.1 REM, 4.3 supine REM). There were 0 respiratory effort-related arousals (RERAs).  The RERA index was 0 events/h. Total respiratory disturbance index (RDI) was 1.3 events/h. RDI results showed: supine RDI  0.8 /h; non-supine RDI 1.7 /h; REM RDI 4.1 /h, supine REM RDI 4.3 /h.   Based on AASM criteria (using a 3% oxygen desaturation and /or arousal rule for scoring hypopneas), there were 4 apneas (0 obstructive; 4 central; 0 mixed), and 21 hypopneas. Apnea index was 0.6. Hypopnea index was 2.9. The apnea-hypopnea index was 3.5/hour overall (2.7 supine, 10 non-supine; 11.1 REM, 12.9 supine  REM).  There were 0 respiratory effort-related arousals (RERAs).  The RERA index was 0 events/h. Total respiratory disturbance index (RDI) was 3.5 events/h. RDI results showed: supine RDI  2.7 /h; non-supine RDI 4.3 /h; REM RDI 11.1 /h, supine REM RDI 12.9 /h.  OXIMETRY: Oxyhemoglobin Saturation Nadir during sleep was at 89%) from a mean of 96%.  Of the Total sleep time (TST)   hypoxemia (=<88%) was present  for  0.1 minutes, or 0.0% of total sleep time.  LIMB MOVEMENTS: There were 0 periodic limb movements of sleep (0.0/hr), of which 0 (0.0/hr) were associated with an arousal. AROUSAL: There were 31 arousals in total, for an arousal index of 4 arousals/hour.  Of these, 1 were identified as respiratory-related arousals (0 /h), 0 were PLM-related arousals (0 /h), and 31 were non-specific arousals (4 /h). No significant snoring was noted.  EEG: Review of the EEG showed no abnormal electrical discharges and symmetrical bihemispheric findings.    EKG: The EKG revealed normal sinus rhythm (NSR). The average heart rate during sleep was 65 bpm.  The heart rate during sleep varied between a minimum of N/A and  a maximum of  82 bpm.  AUDIO/VIDEO REVIEW: The audio and video review did not show any abnormal or unusual behaviors, movements, phonations or vocalizations. The patient took no.  No significant snoring was noted.  POST-STUDY QUESTIONNAIRE: Post study, the patient indicated, that sleep was worse than usual the same as usual.   MSLT: The patient had a nap study, MSLT (mean sleep latency test), next day on 07/04/22, with a mean sleep latency of 13.3 minutes for 4 naps and 0 SOREMPs (sleep onset REM periods) noted. Impression: This MSLT does not show any pathological degree of daytime somnolence, and is not in keeping with findings seen with narcolepsy or idiopathic hypersomnolence. Mild non-specific sleepiness was noted.  IMPRESSION:   1. Excessive daytime sleepiness  2. Dysfunctions associated with sleep stages or arousal from sleep  RECOMMENDATIONS:   1. The overnight polysomnogram does not demonstrate any significant obstructive or central sleep disordered breathing with a total AHI of 3.5/hour and O2 nadir of 89%. Treatment with a positive airway pressure device, such as CPAP or autoPAP is not indicated. 2. The nocturnal PSG and next day MSLT indicate mild daytime somnolence, with a mean sleep  latency of 13.3 minutes for 4 naps, with no REM onset naps noted. These studies do not support the diagnosis of idiopathic hypersomnolence or narcolepsy.  3. The nocturnal polysomnogram showed minimal sleep fragmentation and mildly abnormal sleep stage percentages; these are nonspecific findings and per se do not signify an intrinsic sleep disorder or a cause for the patient's sleep-related symptoms. Causes include (but are not limited to) the first night effect of the sleep study, circadian rhythm disturbances, medication effect or an underlying mood disorder or medical problem.  4. The patient should be cautioned not to drive, work at heights, or operate dangerous or heavy equipment when tired or sleepy. Review and reiteration of good sleep hygiene measures should be pursued with any patient. 5. The patient will be advised to follow-up with her primary care provider.  A follow-up in sleep clinic can be arranged as needed.     I certify that I have reviewed the entire raw data recording prior to the issuance of this report in accordance with the Standards of Accreditation of the American Academy of Sleep Medicine (AASM).  Star Age, MD, PhD [facility]) [place], ABPN (Neurology and  Sleep)              Technical Report:   General Information  Name: Deliah, Strehlow BMI: 23.89 Physician: Star Age, MD  ID: [id number] Height: 66.0 in Technician: Gaylyn Cheers, RPSGT  Sex: Female Weight: 148.0 lb Record:  Scorer: Barrie Lyme RPSGT [id number]    Age: 2 [[birth year]] Date: 07/03/2022    Medical & Medication History Ms. Koy is a 31 year old right-handed woman with an underlying medical history of ADD or ADHD, depression, anxiety, and reflux disease, who reports a longstanding history of excessive daytime somnolence, essentially since high school days. ?She was sleeping in class but did not typically doze off in class. ?He is sleepy at the wheel and  sometimes has to pull over and take a short nap. ?He reports vivid dreams but no telltale hypnagogic or hypnopompic hallucinations, has had sleep paralysis episodes infrequently and denies any typical cataplectic events. ?She has no family history of sleep apnea or narcolepsy. ?She has been on Adderall off and on for about 4 years, she took a 2-year gap when she was breast-feeding her daughter who is now 46 years old. ?The patient is single and lives with her daughter who stays with the dad during the day. ?Patient works as a Agricultural consultant for [employer]. ?She used to work nights and is currently working days. ?She does not have a significant history of snoring or witnessed apneas, denies recurrent morning headaches. ?She has nocturia about once per average night. ?She limits her caffeine. ?She does not have a TV in her bedroom. ?She tries to keep good sleep hygiene. ?They have 2 cats in the household, bedtime is around 10 PM and rise time around 6:30 AM to 7 AM. ?She has a history of anxiety and depression, mostly anxiety. ?She started Celexa when she was about 31 years old and then was changed to Prozac about 6 to 8 months ago. ?She is a non-smoker and drinks alcohol infrequently, maybe twice a month and drinks 1 cup of coffee in the morning. ?She is currently on Adderall 20 mg strength but takes only 1 a day instead of twice daily. ?She is currently taking Prozac 40 mg daily. Her Epworth sleepiness score is 18 out of 24, fatigue severity score is 58 out of 63.      Adderall XR, Vitamin D, Prozac, Mirena, Mobic, fish oil, Turmeric-Ginger          Comments   Patient arrived for a NPSG to be followed by a MSLT for excessive daytime sleepiness. Procedure explained and all questions answered. Standard paste setup without complications. Patient slept on all sides. No audible snoring noted. Very few respiratory events observed. No obvious cardiac arrhythmias noticed. No restroom visits.     Lights out: 10:21:40 PM  Lights on: 06:27:30 AM   Time Total Supine Side Prone Upright  Recording (TRT) 8h 6.23m4h 1.5454mh 55.46m12m 9.54m 846m0.46m  77mep (TST) 7h 7.54m 3h47m.46m 2h 354m46m 1h 656m 0h 0.6m  Late83m N1 N2 N3 REM Onset Per. Slp. Eff.  Actual 0h 0.46m 0h 5.46m62m 20.46m63m 0.46m 071m4.46m 0246m1.54m 87446m%   Stg48mr Wake N1 N2 N3 REM  Total 58.5 11.5 202.0 95.5 118.5  Supine 21.5 3.0 131.5 43.5 42.0  Side 34.0 6.5 41.5 26.5 66.5  Prone 3.0 2.0 29.0 25.5 10.0  Upright 0.0 0.0 0.0 0.0 0.0   Stg % Wake N1 N2 N3 REM  Total 12.0 2.7 47.3 22.3  27.7  Supine 4.4 0.7 30.8 10.2 9.8  Side 7.0 1.5 9.7 6.2 15.6  Prone 0.6 0.5 6.8 6.0 2.3  Upright 0.0 0.0 0.0 0.0 0.0     Apnea Summary Sub Supine Side Prone Upright  Total 4 Total 4 0 4 0 0    REM 4 0 4 0 0    NREM 0 0 0 0 0  Obs 0 REM 0 0 0 0 0    NREM 0 0 0 0 0  Mix 0 REM 0 0 0 0 0    NREM 0 0 0 0 0  Cen 4 REM 4 0 4 0 0    NREM 0 0 0 0 0   Rera Summary Sub Supine Side Prone Upright  Total 0 Total 0 0 0 0 0    REM 0 0 0 0 0    NREM 0 0 0 0 0   Hypopnea Summary Sub Supine Side Prone Upright  Total 21 Total '21 10 11 '$ 0 0    REM '18 9 9 '$ 0 0    NREM '3 1 2 '$ 0 0   4% Hypopnea Summary Sub Supine Side Prone Upright  Total (4%) 6 Total '6 3 3 '$ 0 0    REM '5 3 2 '$ 0 0    NREM 1 0 1 0 0     AHI Total Obs Mix Cen  3.51 Apnea 0.56 0.00 0.00 0.56   Hypopnea 2.95 -- -- --  1.40 Hypopnea (4%) 0.84 -- -- --    Total Supine Side Prone Upright  Position AHI 3.51 2.73 6.38 0.00 0.00  REM AHI 11.14   NREM AHI 0.58   Position RDI 3.51 2.73 6.38 0.00 0.00  REM RDI 11.14   NREM RDI 0.58    4% Hypopnea Total Supine Side Prone Upright  Position AHI (4%) 1.40 0.82 2.98 0.00 0.00  REM AHI (4%) 4.56   NREM AHI (4%) 0.19   Position RDI (4%) 1.40 0.82 2.98 0.00 0.00  REM RDI (4%) 4.56   NREM RDI (4%) 0.19    Desaturation Information Threshold: 2% <100% <90% <80% <70% <60% <50% <40%  Supine 9.0 0.0 0.0 0.0 0.0 0.0 0.0  Side 12.0 0.0 0.0 0.0 0.0 0.0 0.0  Prone 0.0 0.0  0.0 0.0 0.0 0.0 0.0  Upright 0.0 0.0 0.0 0.0 0.0 0.0 0.0  Total 21.0 0.0 0.0 0.0 0.0 0.0 0.0  Index 2.7 0.0 0.0 0.0 0.0 0.0 0.0   Threshold: 3% <100% <90% <80% <70% <60% <50% <40%  Supine 8.0 0.0 0.0 0.0 0.0 0.0 0.0  Side 10.0 0.0 0.0 0.0 0.0 0.0 0.0  Prone 0.0 0.0 0.0 0.0 0.0 0.0 0.0  Upright 0.0 0.0 0.0 0.0 0.0 0.0 0.0  Total 18.0 0.0 0.0 0.0 0.0 0.0 0.0  Index 2.3 0.0 0.0 0.0 0.0 0.0 0.0   Threshold: 4% <100% <90% <80% <70% <60% <50% <40%  Supine 3.0 0.0 0.0 0.0 0.0 0.0 0.0  Side 3.0 0.0 0.0 0.0 0.0 0.0 0.0  Prone 0.0 0.0 0.0 0.0 0.0 0.0 0.0  Upright 0.0 0.0 0.0 0.0 0.0 0.0 0.0  Total 6.0 0.0 0.0 0.0 0.0 0.0 0.0  Index 0.8 0.0 0.0 0.0 0.0 0.0 0.0   Threshold: 3% <100% <90% <80% <70% <60% <50% <40%  Supine 8 0 0 0 0 0 0  Side 10 0 0 0 0 0 0  Prone 0 0 0 0 0 0 0  Upright 0 0 0 0 0  0 0  Total 18 0 0 0 0 0 0   Awakening/Arousal Information # of Awakenings 13  Wake after sleep onset 34.24m Wake after persistent sleep 31.514m Arousal Assoc. Arousals Index  Apneas 0 0.0  Hypopneas 1 0.1  Leg Movements 0 0.0  Snore 0 0.0  PTT Arousals 0 0.0  Spontaneous 31 4.4  Total 32 4.5  Leg Movement Information PLMS LMs Index  Total LMs during PLMS 0 0.0  LMs w/ Microarousals 0 0.0   LM LMs Index  w/ Microarousal 0 0.0  w/ Awakening 0 0.0  w/ Resp Event 0 0.0  Spontaneous 0 0.0  Total 0 0.0     Desaturation threshold setting: 3% Minimum desaturation setting: 10 seconds SaO2 nadir: 73% The longest event was a 41 sec obstructive Hypopnea with a minimum SaO2 of 94%. The lowest SaO2 was 91% associated with a 15 sec obstructive Hypopnea. EKG Rates EKG Avg Max Min  Awake 72 87 61  Asleep 65 82 57  EKG Events: N/A   MSLT Table:   General Information  Name: SnRyane, KoniecznyMI: 21 Physician: SaStar AgeMD  ID: [id number]: [id number] Technician: MeBarrie LymePSGT  Sex: Unknown Weight: 148 lb Record: [id number]  Age: 22365/1992-02-09Date: 07/04/2022 Scorer: MeBarrie LymePSGT   Nap 1 Nap Start: 08:25:34 AM Nap End: 08:45:47 AM    Latency to Sleep Onset: 20.12m39mtal Sleep Time: 0.12m 64m Dur REM N1 N2 N3   0.12m 078m 0.716m0.0656m  N84m2 Nap Start: 10:15:41 AM Nap End: 10:44:16 AM    Latency to Sleep Onset: 13.16m Total312meep Time: 11.16m Stg Du37mEM N1 N2 N3   0.12m 1.12m 1032m 0.66m   N62m3 N316mStart: 12:09:56 PM Nap End: 12:33:37 PM    Latency to Sleep Onset: 8.16m Total Sleep39mme: 14.16m Stg Dur REM 556mN2 N3   0.12m 3.12m 11.16m 0.156m   312m 4 N29mSta74m 02:04:52 PM Nap End: 02:30:16 PM    Latency to Sleep Onset: 11.12m Total Sleep Time:57m.12m Stg Dur REM N1 N2 356m  0.12m 1.16m 10.16m 0.12m    64mn S16mp La516mcy:59m.56m Number of Sleep Onset R77mPeriods: 0

## 2022-07-24 NOTE — Progress Notes (Unsigned)
ANNUAL EXAM Patient name: Kelly Haynes MRN [id number]  Date of birth: 1991/08/07 Chief Complaint:   No chief complaint on file.  History of Present Illness:   Kelly Haynes is a 31 y.o. G1P1 female being seen today for a routine annual exam.   Current complaints: None  No LMP recorded. (Menstrual status: IUD).  Current birth control: IUD  Last pap:  s/p GARDASIL 2012: normal pap 2014: LSIL 2016, 2019: Normal pap 2021: LSIL, HPV pos 04/2020: Colpo with CIN1 05/2020: LEEP - CIN2 with positive margins 11/2020: normal pap, HPV pos  Health Maintenance Due  Topic Date Due   COVID-19 Vaccine (5 - Pfizer risk series) 08/18/2021   INFLUENZA VACCINE  04/23/2022    Review of Systems:   Pertinent items are noted in HPI Denies any headaches, blurred vision, fatigue, shortness of breath, chest pain, abdominal pain, abnormal vaginal discharge/itching/odor/irritation, problems with periods, bowel movements, urination, or intercourse unless otherwise stated above.  Pertinent History Reviewed:  Reviewed past medical,surgical, social and family history.  Reviewed problem list, medications and allergies. Physical Assessment:  There were no vitals filed for this visit.There is no height or weight on file to calculate BMI.   Physical Examination:  General appearance - well appearing, and in no distress Mental status - alert, oriented to person, place, and time Psych:  She has a normal mood and affect Skin - warm and dry, normal color, no suspicious lesions noted Chest - effort normal Heart - normal rate  Breasts - breasts appear normal, no suspicious masses, no skin or nipple changes or axillary nodes Abdomen - soft, nontender, nondistended, no masses or organomegaly Pelvic -  VULVA: normal appearing vulva with no masses, tenderness or lesions  VAGINA: normal appearing vagina with normal color and discharge, no lesions  CERVIX: normal appearing cervix without discharge or lesions, no CMT,  IUD strings not visualized.  UTERUS: uterus is felt to be normal size, shape, consistency and nontender  ADNEXA: No adnexal masses or tenderness noted. Extremities:  No swelling or varicosities noted  Chaperone present for exam  GYNECOLOGY OFFICE COLPOSCOPY PROCEDURE NOTE  31 y.o. G1P1 here for colposcopy for history of recurrent abnormal paps (see above).   Pregnancy test:  negative Gardasil:  completed. Discussed role for HPV in cervical dysplasia, need for surveillance.  Informed consent and review of risks, benefit and alternatives performed. Written consent given.   Speculum inserted into patient's vagina assuring full view of cervix and vaginal walls. 3 swabs of vinegar solution applied to the cervix and vaginal walls and colposcope was used to observe both the cervix and vaginal walls.   Colposcopy adequate? No - full transformation zone not visualized.  No visible lesions or AWE, no mosaicism, no punctation, and no abnormal vasculature; corresponding biopsies obtained.  ECC collected.  All specimens were labeled and sent to pathology.  Speculum removed.  Pt tolerated well with minimal pain and bleeding.     Assessment & Plan:  Diagnoses and all orders for this visit:  History of abnormal cervical Pap smear  Encounter for annual routine gynecological examination  - Cervical cancer screening: Discussed guidelines. Pap with HPV done as well as colposcopy today. See note above.  - Gardasil: completed - GC/CT: accepts - Birth Control: IUD - Breast Health: Encouraged self breast awareness/SBE. Teaching provided.  - F/U 12 months and prn     No orders of the defined types were placed in this encounter.   Meds: No orders of the defined types  were placed in this encounter.   Follow-up: No follow-ups on file.  Radene Gunning, MD 07/24/2022 1:09 PM

## 2022-07-25 ENCOUNTER — Ambulatory Visit (INDEPENDENT_AMBULATORY_CARE_PROVIDER_SITE_OTHER): Payer: No Typology Code available for payment source | Admitting: Obstetrics and Gynecology

## 2022-07-25 ENCOUNTER — Other Ambulatory Visit (HOSPITAL_COMMUNITY)
Admission: RE | Admit: 2022-07-25 | Discharge: 2022-07-25 | Disposition: A | Discharge status: Home / Self Care | Payer: No Typology Code available for payment source | Source: Ambulatory Visit | Attending: Obstetrics and Gynecology | Admitting: Obstetrics and Gynecology

## 2022-07-25 ENCOUNTER — Encounter: Payer: Self-pay | Admitting: Obstetrics and Gynecology

## 2022-07-25 VITALS — BP 114/71 | HR 76 | Ht 66.0 in | Wt 141.0 lb

## 2022-07-25 DIAGNOSIS — Z01419 Encounter for gynecological examination (general) (routine) without abnormal findings: Secondary | ICD-10-CM | POA: Diagnosis not present

## 2022-07-25 DIAGNOSIS — Z8742 Personal history of other diseases of the female genital tract: Secondary | ICD-10-CM | POA: Insufficient documentation

## 2022-07-25 DIAGNOSIS — R87810 Cervical high risk human papillomavirus (HPV) DNA test positive: Secondary | ICD-10-CM

## 2022-07-25 DIAGNOSIS — Z01812 Encounter for preprocedural laboratory examination: Secondary | ICD-10-CM

## 2022-07-25 LAB — POCT URINE PREGNANCY: Preg Test, Ur: NEGATIVE

## 2022-07-26 LAB — SURGICAL PATHOLOGY

## 2022-07-29 LAB — CYTOLOGY - PAP
Chlamydia: NEGATIVE
Comment: NEGATIVE
Comment: NEGATIVE
Comment: NORMAL
Diagnosis: NEGATIVE
Diagnosis: REACTIVE
High risk HPV: NEGATIVE
Neisseria Gonorrhea: NEGATIVE

## 2022-07-30 ENCOUNTER — Ambulatory Visit: Payer: No Typology Code available for payment source | Admitting: Professional

## 2022-08-21 ENCOUNTER — Other Ambulatory Visit (HOSPITAL_COMMUNITY): Payer: Self-pay

## 2022-08-21 MED ORDER — AMPHETAMINE-DEXTROAMPHET ER 20 MG PO CP24: 20.0000 mg | ORAL_CAPSULE | Freq: Two times a day (BID) | ORAL | 0 refills | Status: AC

## 2022-08-21 MED ORDER — AMPHETAMINE-DEXTROAMPHET ER 20 MG PO CP24
20.0000 mg | ORAL_CAPSULE | Freq: Two times a day (BID) | ORAL | 0 refills | Status: AC
  Filled 2022-08-21: qty 60, 30d supply, fill #0

## 2022-08-21 MED ORDER — AMPHETAMINE-DEXTROAMPHET ER 20 MG PO CP24
20.0000 mg | ORAL_CAPSULE | Freq: Two times a day (BID) | ORAL | 0 refills | Status: AC
  Filled 2022-11-25: qty 60, 30d supply, fill #0

## 2022-08-21 NOTE — Progress Notes (Unsigned)
     Established patient visit   Patient: Kelly Haynes   DOB: 08/11/1991   31 y.o. Female  MRN: [id number] Visit Date: 08/22/2022  Today's healthcare provider: Owens Loffler, DO   No chief complaint on file.   SUBJECTIVE   No chief complaint on file.  HPI  Pt presents for wart removal.  She also has concerns of Vit D and B12 deficiency  Review of Systems     No outpatient medications have been marked as taking for the 08/22/22 encounter (Appointment) with Owens Loffler, DO.    OBJECTIVE    There were no vitals taken for this visit.  Physical Exam   {Show previous labs (optional):23736}    ASSESSMENT & PLAN    Problem List Items Addressed This Visit   None   No follow-ups on file.      No orders of the defined types were placed in this encounter.   No orders of the defined types were placed in this encounter.    Owens Loffler, DO  [facility] [phone] (phone) [phone] (fax)  [place]

## 2022-08-22 ENCOUNTER — Encounter: Payer: Self-pay | Admitting: Family Medicine

## 2022-08-22 ENCOUNTER — Ambulatory Visit (INDEPENDENT_AMBULATORY_CARE_PROVIDER_SITE_OTHER): Payer: No Typology Code available for payment source | Admitting: Family Medicine

## 2022-08-22 VITALS — BP 107/65 | HR 81 | Ht 66.0 in | Wt 142.0 lb

## 2022-08-22 DIAGNOSIS — B070 Plantar wart: Secondary | ICD-10-CM | POA: Diagnosis not present

## 2022-08-22 DIAGNOSIS — R5383 Other fatigue: Secondary | ICD-10-CM | POA: Diagnosis not present

## 2022-08-22 NOTE — Assessment & Plan Note (Signed)
-   wart removal done on two lesions on lower extremities bilaterally. Pt tolerated procedure well.

## 2022-08-22 NOTE — Assessment & Plan Note (Signed)
-   due to fatigue will go ahead and order vit d level and b12 to see if this could be the cause of her fatigue. 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

## 2022-08-23 LAB — B12 AND FOLATE PANEL
Folate: 15.7 ng/mL
Vitamin B-12: 1037 pg/mL (ref 200–1100)

## 2022-08-23 LAB — VITAMIN D 25 HYDROXY (VIT D DEFICIENCY, FRACTURES): Vit D, 25-Hydroxy: 33 ng/mL (ref 30–100)

## 2022-08-30 ENCOUNTER — Other Ambulatory Visit (HOSPITAL_COMMUNITY): Payer: Self-pay

## 2022-10-01 ENCOUNTER — Encounter: Payer: Self-pay | Admitting: Family Medicine

## 2022-10-15 ENCOUNTER — Other Ambulatory Visit (HOSPITAL_COMMUNITY): Payer: Self-pay

## 2022-10-15 DIAGNOSIS — F900 Attention-deficit hyperactivity disorder, predominantly inattentive type: Secondary | ICD-10-CM | POA: Diagnosis not present

## 2022-10-15 MED ORDER — AMPHETAMINE-DEXTROAMPHET ER 20 MG PO CP24
20.0000 mg | ORAL_CAPSULE | Freq: Two times a day (BID) | ORAL | 0 refills | Status: AC
  Filled 2022-10-15: qty 60, 30d supply, fill #0

## 2022-10-30 ENCOUNTER — Other Ambulatory Visit (HOSPITAL_COMMUNITY): Payer: Self-pay

## 2022-10-30 MED ORDER — FLUOXETINE HCL 20 MG PO CAPS
20.0000 mg | ORAL_CAPSULE | Freq: Every day | ORAL | 3 refills | Status: DC
  Filled 2022-10-30: qty 30, 30d supply, fill #0
  Filled 2022-11-25: qty 30, 30d supply, fill #1
  Filled 2022-12-22: qty 30, 30d supply, fill #2
  Filled 2023-02-22: qty 30, 30d supply, fill #3

## 2022-11-25 ENCOUNTER — Other Ambulatory Visit (HOSPITAL_COMMUNITY): Payer: Self-pay

## 2023-01-17 ENCOUNTER — Other Ambulatory Visit (HOSPITAL_COMMUNITY): Payer: Self-pay

## 2023-01-17 DIAGNOSIS — F330 Major depressive disorder, recurrent, mild: Secondary | ICD-10-CM | POA: Diagnosis not present

## 2023-01-17 DIAGNOSIS — F900 Attention-deficit hyperactivity disorder, predominantly inattentive type: Secondary | ICD-10-CM | POA: Diagnosis not present

## 2023-01-17 DIAGNOSIS — F411 Generalized anxiety disorder: Secondary | ICD-10-CM | POA: Diagnosis not present

## 2023-01-17 MED ORDER — FLUOXETINE HCL 20 MG PO CAPS
20.0000 mg | ORAL_CAPSULE | Freq: Every day | ORAL | 3 refills | Status: DC
  Filled 2023-01-17: qty 30, 30d supply, fill #0
  Filled 2023-03-23: qty 30, 30d supply, fill #1
  Filled 2023-04-30: qty 30, 30d supply, fill #2
  Filled 2023-06-02: qty 30, 30d supply, fill #3

## 2023-01-17 MED ORDER — AMPHETAMINE-DEXTROAMPHET ER 20 MG PO CP24
20.0000 mg | ORAL_CAPSULE | Freq: Two times a day (BID) | ORAL | 0 refills | Status: DC
  Filled 2023-01-17: qty 60, 30d supply, fill #0

## 2023-02-22 ENCOUNTER — Other Ambulatory Visit (HOSPITAL_COMMUNITY): Payer: Self-pay

## 2023-02-24 ENCOUNTER — Other Ambulatory Visit: Payer: Self-pay

## 2023-02-25 ENCOUNTER — Other Ambulatory Visit (HOSPITAL_COMMUNITY): Payer: Self-pay

## 2023-02-25 MED ORDER — AMPHETAMINE-DEXTROAMPHET ER 20 MG PO CP24
20.0000 mg | ORAL_CAPSULE | Freq: Two times a day (BID) | ORAL | 0 refills | Status: DC
  Filled 2023-02-25: qty 60, 30d supply, fill #0

## 2023-03-23 ENCOUNTER — Other Ambulatory Visit (HOSPITAL_COMMUNITY): Payer: Self-pay

## 2023-04-01 ENCOUNTER — Other Ambulatory Visit (HOSPITAL_COMMUNITY): Payer: Self-pay

## 2023-04-01 MED ORDER — AMPHETAMINE-DEXTROAMPHET ER 20 MG PO CP24
20.0000 mg | ORAL_CAPSULE | Freq: Two times a day (BID) | ORAL | 0 refills | Status: DC
  Filled 2023-04-01 (×2): qty 60, 30d supply, fill #0

## 2023-04-02 ENCOUNTER — Other Ambulatory Visit: Payer: Self-pay

## 2023-04-02 ENCOUNTER — Other Ambulatory Visit (HOSPITAL_COMMUNITY): Payer: Self-pay

## 2023-04-03 ENCOUNTER — Other Ambulatory Visit: Payer: Self-pay | Admitting: Oncology

## 2023-04-03 DIAGNOSIS — Z006 Encounter for examination for normal comparison and control in clinical research program: Secondary | ICD-10-CM

## 2023-04-21 ENCOUNTER — Other Ambulatory Visit (HOSPITAL_COMMUNITY): Payer: Self-pay

## 2023-04-21 DIAGNOSIS — F900 Attention-deficit hyperactivity disorder, predominantly inattentive type: Secondary | ICD-10-CM | POA: Diagnosis not present

## 2023-04-21 DIAGNOSIS — F330 Major depressive disorder, recurrent, mild: Secondary | ICD-10-CM | POA: Diagnosis not present

## 2023-04-21 DIAGNOSIS — F411 Generalized anxiety disorder: Secondary | ICD-10-CM | POA: Diagnosis not present

## 2023-04-21 MED ORDER — FLUOXETINE HCL 20 MG PO CAPS
20.0000 mg | ORAL_CAPSULE | Freq: Every day | ORAL | 3 refills | Status: DC
  Filled 2023-04-21 – 2023-06-29 (×2): qty 30, 30d supply, fill #0

## 2023-04-21 MED ORDER — AMPHETAMINE-DEXTROAMPHET ER 20 MG PO CP24
20.0000 mg | ORAL_CAPSULE | Freq: Two times a day (BID) | ORAL | 0 refills | Status: AC
  Filled 2023-04-21: qty 60, 30d supply, fill #0

## 2023-05-19 ENCOUNTER — Other Ambulatory Visit (HOSPITAL_COMMUNITY): Payer: Self-pay

## 2023-05-19 MED ORDER — AMPHETAMINE-DEXTROAMPHET ER 20 MG PO CP24
20.0000 mg | ORAL_CAPSULE | Freq: Two times a day (BID) | ORAL | 0 refills | Status: AC
Start: 2023-05-19 — End: ?
  Filled 2023-05-19: qty 60, 30d supply, fill #0

## 2023-06-12 ENCOUNTER — Encounter: Payer: 59 | Admitting: Family Medicine

## 2023-06-18 ENCOUNTER — Encounter: Payer: 59 | Admitting: Family Medicine

## 2023-06-29 ENCOUNTER — Other Ambulatory Visit (HOSPITAL_COMMUNITY): Payer: Self-pay

## 2023-06-30 ENCOUNTER — Other Ambulatory Visit (HOSPITAL_COMMUNITY): Payer: Self-pay

## 2023-06-30 MED ORDER — AMPHETAMINE-DEXTROAMPHET ER 20 MG PO CP24
20.0000 mg | ORAL_CAPSULE | Freq: Two times a day (BID) | ORAL | 0 refills | Status: AC
Start: 2023-06-30 — End: ?
  Filled 2023-06-30: qty 60, 30d supply, fill #0

## 2023-07-01 ENCOUNTER — Ambulatory Visit (INDEPENDENT_AMBULATORY_CARE_PROVIDER_SITE_OTHER): Payer: 59 | Admitting: Family Medicine

## 2023-07-01 ENCOUNTER — Encounter: Payer: Self-pay | Admitting: Family Medicine

## 2023-07-01 VITALS — BP 115/72 | HR 96 | Ht 66.0 in | Wt 139.8 lb

## 2023-07-01 DIAGNOSIS — B070 Plantar wart: Secondary | ICD-10-CM

## 2023-07-01 DIAGNOSIS — Z0000 Encounter for general adult medical examination without abnormal findings: Secondary | ICD-10-CM | POA: Insufficient documentation

## 2023-07-01 NOTE — Assessment & Plan Note (Signed)
Pt doing well with diet and exercise. 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  - follow up in one year

## 2023-07-01 NOTE — Progress Notes (Signed)
Established patient visit   Patient: Kelly Haynes   DOB: 1990/12/24   32 y.o. Female  MRN: [id number] Visit Date: 07/01/2023  Today's healthcare provider: Charlton Amor, DO   Chief Complaint  Patient presents with   Annual Exam    SUBJECTIVE    Chief Complaint  Patient presents with   Annual Exam   HPI  Pt presents for wellness check. Doing well no concerns  Review of Systems  Constitutional:  Negative for activity change, fatigue and fever.  Respiratory:  Negative for cough and shortness of breath.   Cardiovascular:  Negative for chest pain.  Gastrointestinal:  Negative for abdominal pain.  Genitourinary:  Negative for difficulty urinating.       Current Meds  Medication Sig   amphetamine-dextroamphetamine (ADDERALL XR) 20 MG 24 hr capsule Take 1 capsule (20 mg total) by mouth 2 (two) times daily.   amphetamine-dextroamphetamine (ADDERALL XR) 20 MG 24 hr capsule Take 1 capsule (20 mg total) by mouth 2 (two) times daily.   amphetamine-dextroamphetamine (ADDERALL XR) 20 MG 24 hr capsule Take 1 capsule (20 mg total) by mouth 2 (two) times daily. (10-16-22)   amphetamine-dextroamphetamine (ADDERALL XR) 20 MG 24 hr capsule Take 1 capsule (20 mg total) by mouth 2 (two) times daily.   amphetamine-dextroamphetamine (ADDERALL XR) 20 MG 24 hr capsule Take 1 capsule (20 mg total) by mouth 2 (two) times daily.   amphetamine-dextroamphetamine (ADDERALL XR) 20 MG 24 hr capsule Take 1 capsule (20 mg total) by mouth 2 (two) times daily.   amphetamine-dextroamphetamine (ADDERALL XR) 20 MG 24 hr capsule Take 1 capsule (20 mg total) by mouth 2 (two) times daily.   amphetamine-dextroamphetamine (ADDERALL XR) 20 MG 24 hr capsule Take 1 capsule (20 mg total) by mouth 2 (two) times daily.   amphetamine-dextroamphetamine (ADDERALL XR) 20 MG 24 hr capsule Take 1 capsule (20 mg total) by mouth 2 (two) times daily.   FLUoxetine (PROZAC) 20 MG capsule Take 1 capsule (20 mg total) by mouth daily.    FLUoxetine (PROZAC) 20 MG capsule Take 1 capsule (20 mg total) by mouth daily.   FLUoxetine (PROZAC) 20 MG capsule Take 1 capsule (20 mg total) by mouth daily.   levonorgestrel (MIRENA) 20 MCG/24HR IUD by Intrauterine route.   Omega-3 Fatty Acids (FISH OIL OMEGA-3 PO) Take by mouth. 4080mg  daily   TURMERIC-GINGER PO Take by mouth. 1950mg  daily    OBJECTIVE    BP 115/72   Pulse 96   Ht 5\' 6"  (1.676 m)   Wt 139 lb 12 oz (63.4 kg)   SpO2 99%   BMI 22.56 kg/m   Physical Exam Vitals and nursing note reviewed.  Constitutional:      General: She is not in acute distress.    Appearance: Normal appearance.  HENT:     Head: Normocephalic and atraumatic.     Right Ear: Tympanic membrane, ear canal and external ear normal.     Left Ear: Tympanic membrane, ear canal and external ear normal.     Nose: Nose normal.  Eyes:     Conjunctiva/sclera: Conjunctivae normal.  Cardiovascular:     Rate and Rhythm: Normal rate and regular rhythm.  Pulmonary:     Effort: Pulmonary effort is normal.     Breath sounds: Normal breath sounds.  Abdominal:     General: Abdomen is flat. Bowel sounds are normal.     Palpations: Abdomen is soft.  Neurological:     General: No focal  deficit present.     Mental Status: She is alert and oriented to person, place, and time.  Psychiatric:        Mood and Affect: Mood normal.        Behavior: Behavior normal.        Thought Content: Thought content normal.        Judgment: Judgment normal.     Cryotherapy template Procedure: Cryodestruction of: Consent obtained and verified. Time-out conducted. Noted no overlying erythema, induration, or other signs of local infection. Completed without difficulty using Cryo-Gun. Advised to call if fevers/chills, erythema, induration, drainage, or persistent bleeding.     ASSESSMENT & PLAN    Problem List Items Addressed This Visit       Musculoskeletal and Integument   Plantar wart    Crythotherapy done on  two warts. Pt tolerated well        Other   Routine adult health maintenance - Primary    Pt doing well with diet and exercise. 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      No orders of the defined types were placed in this encounter.   No orders of the defined types were placed in this encounter.    Charlton Amor, DO  [facility] at [facility] [phone] (phone) [phone] (fax)  [facility]

## 2023-07-01 NOTE — Assessment & Plan Note (Signed)
Crythotherapy done on two warts. Pt tolerated well

## 2023-07-04 ENCOUNTER — Other Ambulatory Visit (HOSPITAL_COMMUNITY): Payer: Self-pay

## 2023-07-08 ENCOUNTER — Telehealth: Payer: Self-pay | Admitting: *Deleted

## 2023-07-08 NOTE — Telephone Encounter (Signed)
Left patient a message to call and schedule annual that is due after 07/26/2023.

## 2023-07-28 ENCOUNTER — Other Ambulatory Visit (HOSPITAL_COMMUNITY): Payer: Self-pay

## 2023-07-28 DIAGNOSIS — F330 Major depressive disorder, recurrent, mild: Secondary | ICD-10-CM | POA: Diagnosis not present

## 2023-07-28 DIAGNOSIS — F900 Attention-deficit hyperactivity disorder, predominantly inattentive type: Secondary | ICD-10-CM | POA: Diagnosis not present

## 2023-07-28 DIAGNOSIS — F411 Generalized anxiety disorder: Secondary | ICD-10-CM | POA: Diagnosis not present

## 2023-07-28 MED ORDER — FLUOXETINE HCL 20 MG PO CAPS
20.0000 mg | ORAL_CAPSULE | Freq: Every day | ORAL | 3 refills | Status: DC
Start: 2023-07-28 — End: 2023-12-15
  Filled 2023-07-28: qty 30, 30d supply, fill #0
  Filled 2023-08-29: qty 30, 30d supply, fill #1
  Filled 2023-09-26: qty 30, 30d supply, fill #2
  Filled 2023-10-27: qty 30, 30d supply, fill #3

## 2023-07-28 MED ORDER — AMPHETAMINE-DEXTROAMPHETAMINE 20 MG PO TABS
20.0000 mg | ORAL_TABLET | Freq: Every day | ORAL | 0 refills | Status: DC
Start: 2023-07-28 — End: 2023-09-09
  Filled 2023-07-28: qty 13, 13d supply, fill #0
  Filled 2023-07-28: qty 17, 17d supply, fill #0

## 2023-07-28 MED ORDER — AMPHETAMINE-DEXTROAMPHET ER 20 MG PO CP24
20.0000 mg | ORAL_CAPSULE | Freq: Every morning | ORAL | 0 refills | Status: AC
Start: 2023-07-28 — End: ?
  Filled 2023-07-28: qty 30, 30d supply, fill #0

## 2023-07-28 MED ORDER — AMPHETAMINE-DEXTROAMPHET ER 25 MG PO CP24
25.0000 mg | ORAL_CAPSULE | Freq: Every morning | ORAL | 0 refills | Status: AC
Start: 2023-07-28 — End: ?
  Filled 2023-07-28: qty 30, 30d supply, fill #0

## 2023-08-06 IMAGING — DX DG CERVICAL SPINE COMPLETE 4+V
6 series · 6 of 6 positions shown · non-contrast
Comparison: None.

CLINICAL DATA: Chronic neck pain, right worse than left. Initial
encounter

EXAM:
CERVICAL SPINE - COMPLETE 4+ VIEW

[c-spine lat]
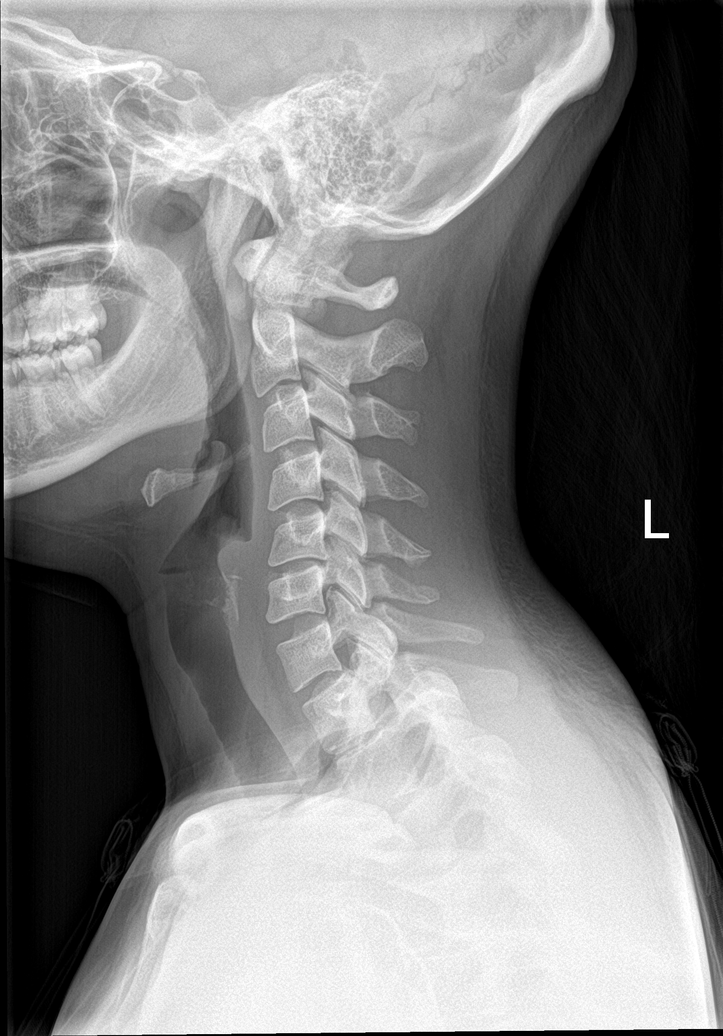

[c-spine obl (1 of 2)]
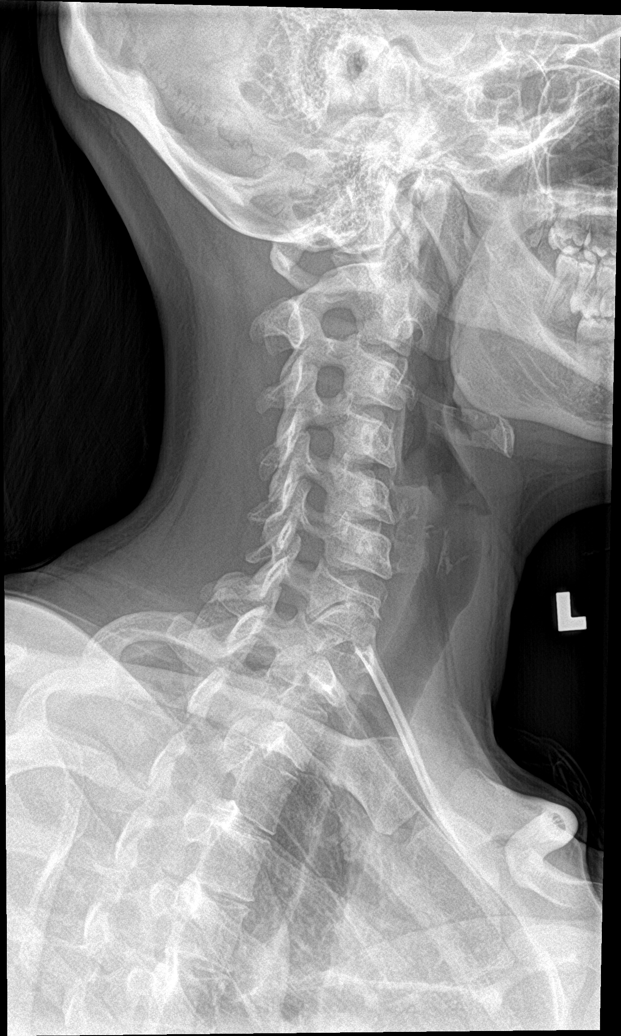

[c-spine obl (2 of 2)]
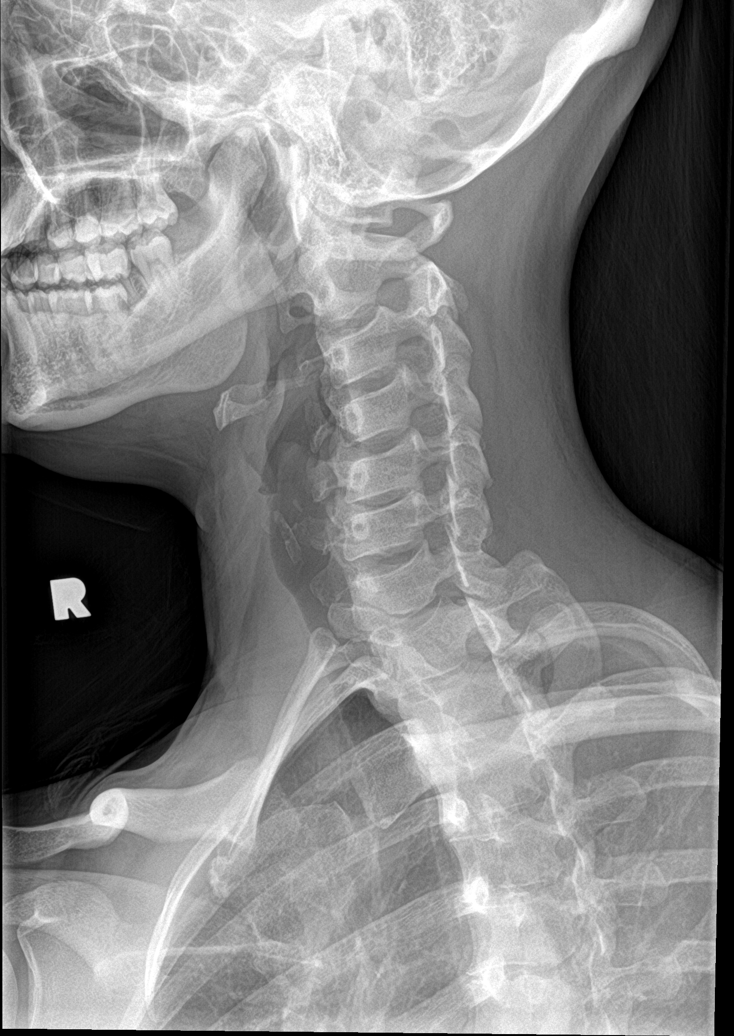

[c-spine ap]
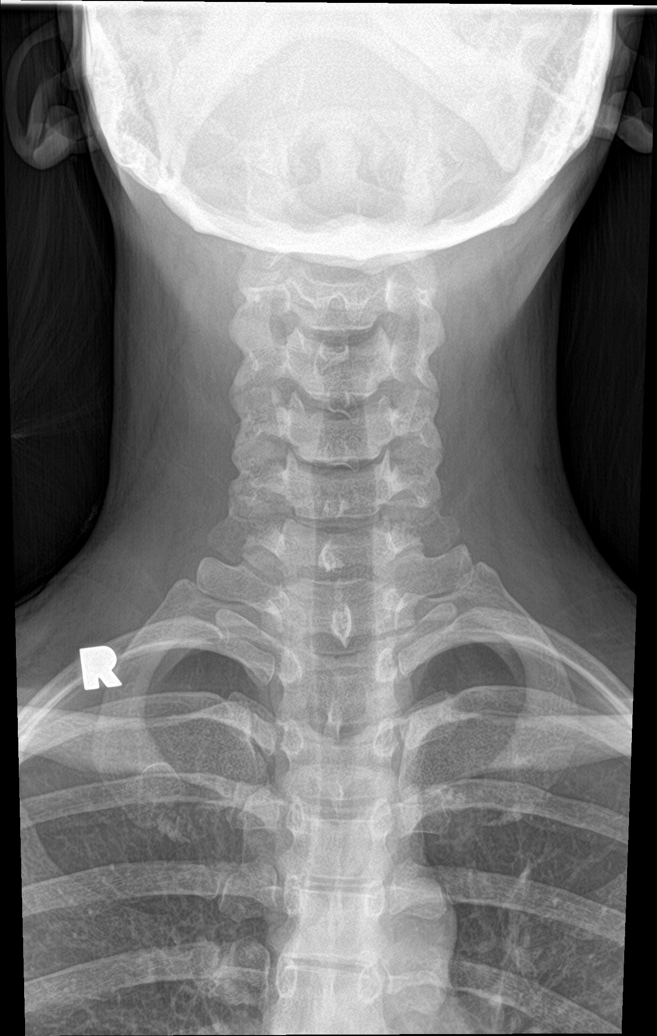

[c-spine open mouth]
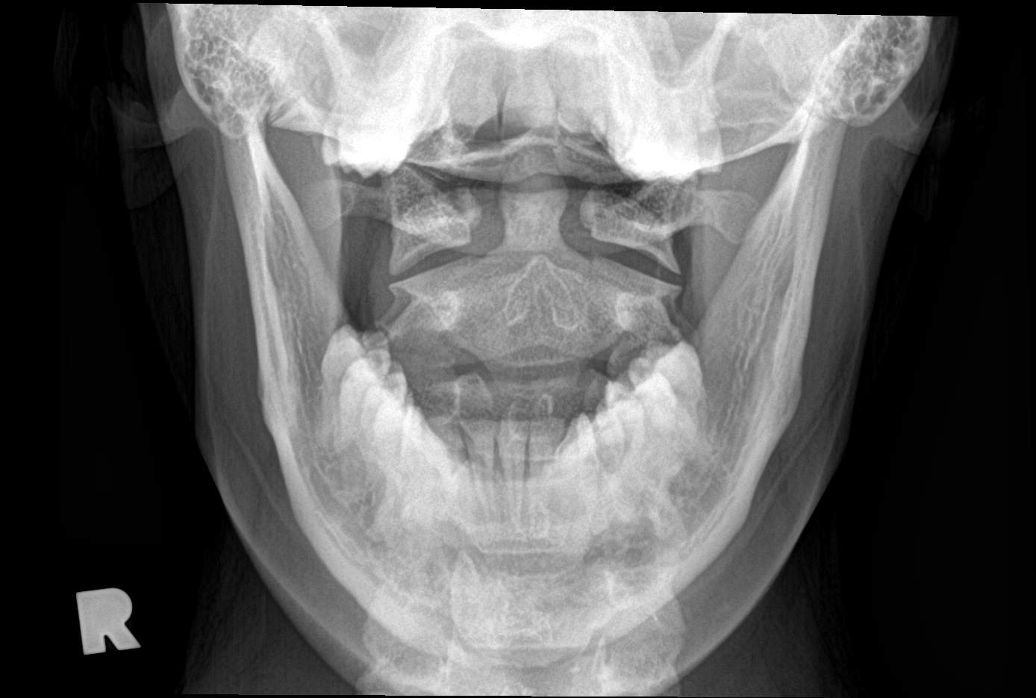

[c-spine swimmers]
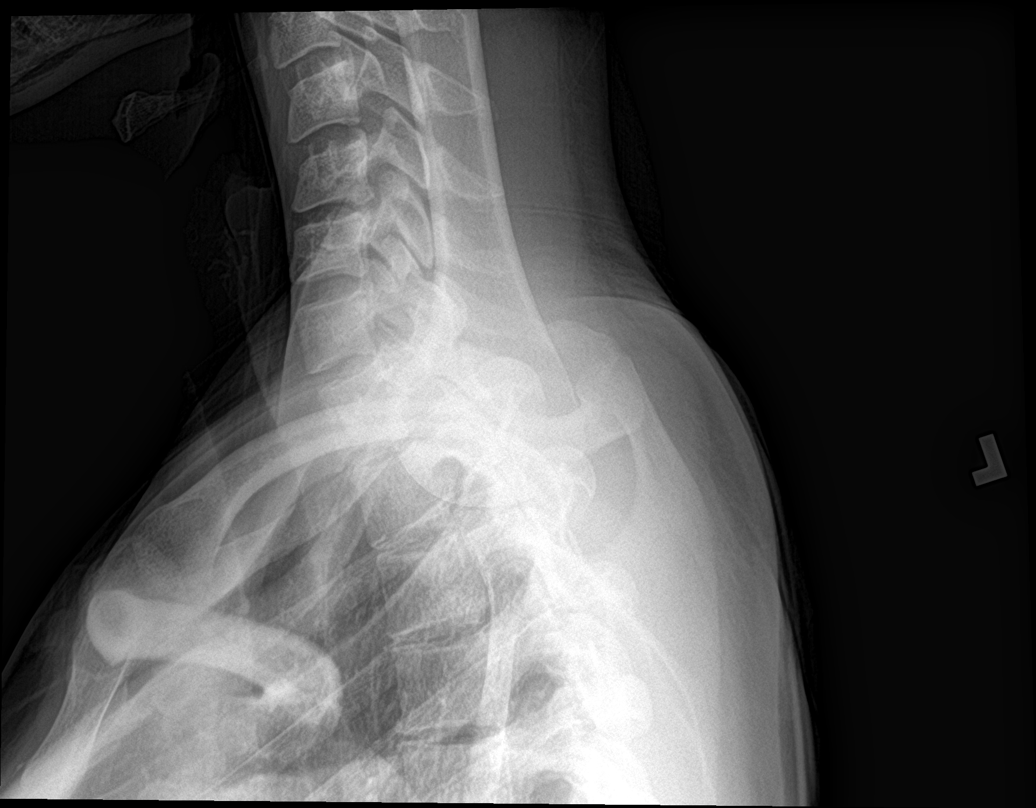

[6 of 6 positions shown; findings below may reference images not displayed]

FINDINGS: Seven cervical segments are well visualized. Mild loss of normal
cervical lordosis is noted which may be related to muscular spasm.
Prevertebral soft tissues are within normal limits. Neural foramina
are widely patent. No acute fracture or acute facet abnormality is
noted.
IMPRESSION: No acute abnormality noted aside from changes suspicious for
muscular spasm.

## 2023-08-08 ENCOUNTER — Encounter: Payer: Self-pay | Admitting: Obstetrics and Gynecology

## 2023-08-08 ENCOUNTER — Ambulatory Visit (INDEPENDENT_AMBULATORY_CARE_PROVIDER_SITE_OTHER): Payer: 59 | Admitting: Obstetrics and Gynecology

## 2023-08-08 ENCOUNTER — Other Ambulatory Visit (HOSPITAL_COMMUNITY)
Admission: RE | Admit: 2023-08-08 | Discharge: 2023-08-08 | Disposition: A | Discharge status: Home / Self Care | Payer: 59 | Source: Ambulatory Visit | Attending: Obstetrics and Gynecology | Admitting: Obstetrics and Gynecology

## 2023-08-08 VITALS — BP 120/71 | HR 94 | Ht 66.0 in | Wt 138.0 lb

## 2023-08-08 DIAGNOSIS — Z1151 Encounter for screening for human papillomavirus (HPV): Secondary | ICD-10-CM | POA: Diagnosis not present

## 2023-08-08 DIAGNOSIS — Z01419 Encounter for gynecological examination (general) (routine) without abnormal findings: Secondary | ICD-10-CM | POA: Diagnosis not present

## 2023-08-08 NOTE — Progress Notes (Signed)
GYNECOLOGY ANNUAL PREVENTATIVE CARE ENCOUNTER NOTE  History:     Kelly Haynes is a 32 y.o. G1P1 female here for a routine annual gynecologic exam.  Current complaints: None.   Denies abnormal vaginal bleeding, discharge, pelvic pain, problems with intercourse or other gynecologic concerns.    Gynecologic History No LMP recorded. (Menstrual status: IUD). Contraception: IUD Last Pap: 2023. Result was normal with negative HPV, history of LEEP Last Mammogram: NA  Obstetric History OB History  Gravida Para Term Preterm AB Living  1 1       1   SAB IAB Ectopic Multiple Live Births          1    # Outcome Date GA Lbr Len/2nd Weight Sex Type Anes PTL Lv  1 Para 06/30/18    F Vag-Spont       Past Medical History:  Diagnosis Date   Abnormal Pap smear of cervix    ADHD    Anxiety state 06/16/2015   Depression 06/16/2015   GERD (gastroesophageal reflux disease) 06/13/2015   Lactose intolerance 06/16/2015    Past Surgical History:  Procedure Laterality Date   COCCYX REMOVAL  2012   TONSILLECTOMY  06/16/2015   WISDOM TOOTH EXTRACTION  2009    Current Outpatient Medications on File Prior to Visit  Medication Sig Dispense Refill   amphetamine-dextroamphetamine (ADDERALL XR) 20 MG 24 hr capsule Take 1 capsule (20 mg total) by mouth 2 (two) times daily. 60 capsule 0   amphetamine-dextroamphetamine (ADDERALL XR) 20 MG 24 hr capsule Take 1 capsule (20 mg total) by mouth 2 (two) times daily. 60 capsule 0   amphetamine-dextroamphetamine (ADDERALL XR) 20 MG 24 hr capsule Take 1 capsule (20 mg total) by mouth 2 (two) times daily. (10-16-22) 60 capsule 0   amphetamine-dextroamphetamine (ADDERALL XR) 20 MG 24 hr capsule Take 1 capsule (20 mg total) by mouth 2 (two) times daily. 60 capsule 0   amphetamine-dextroamphetamine (ADDERALL XR) 20 MG 24 hr capsule Take 1 capsule (20 mg total) by mouth 2 (two) times daily. 60 capsule 0   amphetamine-dextroamphetamine (ADDERALL XR) 20 MG 24 hr capsule  Take 1 capsule (20 mg total) by mouth 2 (two) times daily. 60 capsule 0   amphetamine-dextroamphetamine (ADDERALL XR) 20 MG 24 hr capsule Take 1 capsule (20 mg total) by mouth 2 (two) times daily. 60 capsule 0   amphetamine-dextroamphetamine (ADDERALL XR) 20 MG 24 hr capsule Take 1 capsule (20 mg total) by mouth 2 (two) times daily. 60 capsule 0   amphetamine-dextroamphetamine (ADDERALL XR) 20 MG 24 hr capsule Take 1 capsule (20 mg total) by mouth 2 (two) times daily. 60 capsule 0   amphetamine-dextroamphetamine (ADDERALL XR) 20 MG 24 hr capsule Take 1 capsule (20 mg total) by mouth every morning. 30 capsule 0   amphetamine-dextroamphetamine (ADDERALL XR) 25 MG 24 hr capsule Take 1 capsule by mouth in the morning. 30 capsule 0   amphetamine-dextroamphetamine (ADDERALL) 20 MG tablet Take 1 tablet (20 mg total) by mouth daily in the afternoon. 30 tablet 0   FLUoxetine (PROZAC) 20 MG capsule Take 1 capsule (20 mg total) by mouth daily. 30 capsule 3   levonorgestrel (MIRENA) 20 MCG/24HR IUD by Intrauterine route.     Omega-3 Fatty Acids (FISH OIL OMEGA-3 PO) Take by mouth. 4080mg  daily     TURMERIC-GINGER PO Take by mouth. 1950mg  daily     No current facility-administered medications on file prior to visit.    Allergies  Allergen Reactions  Lactose Intolerance (Gi)     Social History:  reports that she has never smoked. She has never used smokeless tobacco. She reports current alcohol use. She reports that she does not use drugs.  Family History  Problem Relation Age of Onset   Healthy Mother    Depression Father    Other Father        Pre-cancerous colon polyps approx 2015   Retinitis pigmentosa Father    Depression Sister    Prostate cancer Maternal Grandfather    Hypertension Maternal Grandfather    Depression Paternal Grandmother    Hypertension Paternal Grandmother    Hypertension Paternal Grandfather    Prostate cancer Paternal Grandfather    Depression Cousin    Colon cancer  Neg Hx    Esophageal cancer Neg Hx    Rectal cancer Neg Hx    Stomach cancer Neg Hx    Pancreatic cancer Neg Hx    Liver disease Neg Hx     The following portions of the patient's history were reviewed and updated as appropriate: allergies, current medications, past family history, past medical history, past social history, past surgical history and problem list.  Review of Systems Pertinent items noted in HPI and remainder of comprehensive ROS otherwise negative.  Physical Exam:  BP 120/71   Pulse 94   Ht 5\' 6"  (1.676 m)   Wt 138 lb (62.6 kg)   BMI 22.27 kg/m  CONSTITUTIONAL: Well-developed, well-nourished female in no acute distress.  HENT:  Normocephalic, atraumatic, External right and left ear normal.  EYES: Conjunctivae and EOM are normal. Pupils are equal, round, and reactive to light. No scleral icterus.  NECK: Normal range of motion, supple, no masses.  Normal thyroid.  SKIN: Skin is warm and dry. No rash noted. Not diaphoretic. No erythema. No pallor. MUSCULOSKELETAL: Normal range of motion. No tenderness.  No cyanosis, clubbing, or edema. NEUROLOGIC: Alert and oriented to person, place, and time. Normal reflexes, muscle tone coordination.  PSYCHIATRIC: Normal mood and affect. Normal behavior. Normal judgment and thought content. CARDIOVASCULAR: Normal heart rate noted, regular rhythm RESPIRATORY: Clear to auscultation bilaterally. Effort and breath sounds normal, no problems with respiration noted. BREASTS: Symmetric in size. No masses, tenderness, skin changes, nipple drainage, or lymphadenopathy bilaterally. Performed in the presence of a chaperone. ABDOMEN: Soft, no distention noted.  No tenderness, rebound or guarding.  PELVIC: Normal appearing external genitalia and urethral meatus; normal appearing vaginal mucosa and cervix.  No abnormal vaginal discharge noted.  Pap smear obtained.  Normal uterine size, no other palpable masses, no uterine or adnexal tenderness.   Performed in the presence of a chaperone.   Assessment and Plan:   1. Well woman exam  - Cytology - PAP    Will follow up results of pap smear and manage accordingly. Normal breast examination today, she was advised to perform periodic self breast examinations.  Routine preventative health maintenance measures emphasized. Please refer to After Visit Summary for other counseling recommendations.      Antoinett Dorman, Harolyn Rutherford, NP [facility] [employer]

## 2023-08-13 LAB — CYTOLOGY - PAP
Comment: NEGATIVE
Diagnosis: NEGATIVE
High risk HPV: NEGATIVE

## 2023-08-29 ENCOUNTER — Other Ambulatory Visit (HOSPITAL_COMMUNITY): Payer: Self-pay

## 2023-08-29 ENCOUNTER — Other Ambulatory Visit: Payer: Self-pay | Admitting: Obstetrics and Gynecology

## 2023-08-29 MED ORDER — FLUCONAZOLE 150 MG PO TABS
150.0000 mg | ORAL_TABLET | Freq: Every day | ORAL | 0 refills | Status: AC
  Filled 2023-08-29: qty 2, 3d supply, fill #0

## 2023-09-01 ENCOUNTER — Other Ambulatory Visit (HOSPITAL_COMMUNITY): Payer: Self-pay

## 2023-09-09 ENCOUNTER — Other Ambulatory Visit (HOSPITAL_COMMUNITY): Payer: Self-pay

## 2023-09-09 MED ORDER — AMPHETAMINE-DEXTROAMPHETAMINE 20 MG PO TABS
20.0000 mg | ORAL_TABLET | Freq: Every day | ORAL | 0 refills | Status: DC
Start: 2023-09-09 — End: 2023-10-16
  Filled 2023-09-09: qty 30, 30d supply, fill #0

## 2023-09-09 MED ORDER — AMPHETAMINE-DEXTROAMPHET ER 20 MG PO CP24
20.0000 mg | ORAL_CAPSULE | Freq: Every morning | ORAL | 0 refills | Status: AC
  Filled 2023-09-09: qty 30, 30d supply, fill #0

## 2023-10-16 ENCOUNTER — Other Ambulatory Visit (HOSPITAL_COMMUNITY): Payer: Self-pay

## 2023-10-16 MED ORDER — AMPHETAMINE-DEXTROAMPHETAMINE 20 MG PO TABS
20.0000 mg | ORAL_TABLET | Freq: Every day | ORAL | 0 refills | Status: DC
  Filled 2023-10-16: qty 30, 30d supply, fill #0

## 2023-10-16 MED ORDER — AMPHETAMINE-DEXTROAMPHET ER 20 MG PO CP24
20.0000 mg | ORAL_CAPSULE | Freq: Every morning | ORAL | 0 refills | Status: AC
  Filled 2023-10-16: qty 30, 30d supply, fill #0

## 2023-10-20 ENCOUNTER — Other Ambulatory Visit (HOSPITAL_COMMUNITY): Payer: Self-pay

## 2023-10-27 ENCOUNTER — Other Ambulatory Visit (HOSPITAL_COMMUNITY): Payer: Self-pay

## 2023-11-25 ENCOUNTER — Other Ambulatory Visit (HOSPITAL_COMMUNITY): Payer: Self-pay

## 2023-11-25 MED ORDER — AMPHETAMINE-DEXTROAMPHET ER 20 MG PO CP24
20.0000 mg | ORAL_CAPSULE | Freq: Every morning | ORAL | 0 refills | Status: AC
  Filled 2023-11-25: qty 30, 30d supply, fill #0

## 2023-11-25 MED ORDER — AMPHETAMINE-DEXTROAMPHETAMINE 20 MG PO TABS
20.0000 mg | ORAL_TABLET | Freq: Every day | ORAL | 0 refills | Status: DC
  Filled 2023-11-25: qty 30, 30d supply, fill #0

## 2023-12-15 ENCOUNTER — Other Ambulatory Visit (HOSPITAL_COMMUNITY): Payer: Self-pay

## 2023-12-15 MED ORDER — FLUOXETINE HCL 20 MG PO CAPS
20.0000 mg | ORAL_CAPSULE | Freq: Every day | ORAL | 3 refills | Status: AC
  Filled 2023-12-15: qty 30, 30d supply, fill #0
  Filled 2024-01-24: qty 30, 30d supply, fill #1
  Filled 2024-04-01: qty 30, 30d supply, fill #2
  Filled 2024-05-05: qty 30, 30d supply, fill #3

## 2023-12-29 ENCOUNTER — Other Ambulatory Visit (HOSPITAL_COMMUNITY): Payer: Self-pay

## 2023-12-29 MED ORDER — AMPHETAMINE-DEXTROAMPHETAMINE 20 MG PO TABS
20.0000 mg | ORAL_TABLET | Freq: Every day | ORAL | 0 refills | Status: AC
  Filled 2023-12-29: qty 30, 30d supply, fill #0

## 2023-12-29 MED ORDER — AMPHETAMINE-DEXTROAMPHET ER 20 MG PO CP24
20.0000 mg | ORAL_CAPSULE | Freq: Every morning | ORAL | 0 refills | Status: DC
Start: 2023-12-29 — End: 2024-02-05
  Filled 2023-12-29: qty 30, 30d supply, fill #0

## 2024-01-29 ENCOUNTER — Encounter: Payer: Self-pay | Admitting: Family Medicine

## 2024-02-04 ENCOUNTER — Other Ambulatory Visit: Payer: Self-pay

## 2024-02-04 ENCOUNTER — Other Ambulatory Visit (HOSPITAL_COMMUNITY): Payer: Self-pay

## 2024-02-05 ENCOUNTER — Other Ambulatory Visit: Payer: Self-pay

## 2024-02-05 ENCOUNTER — Other Ambulatory Visit (HOSPITAL_COMMUNITY): Payer: Self-pay

## 2024-02-05 MED ORDER — AMPHETAMINE-DEXTROAMPHET ER 20 MG PO CP24
20.0000 mg | ORAL_CAPSULE | Freq: Every morning | ORAL | 0 refills | Status: AC
  Filled 2024-02-05: qty 30, 30d supply, fill #0

## 2024-02-05 MED ORDER — FLUOXETINE HCL 20 MG PO CAPS
20.0000 mg | ORAL_CAPSULE | Freq: Every morning | ORAL | 1 refills | Status: AC
  Filled 2024-02-26: qty 30, 30d supply, fill #0

## 2024-02-05 MED ORDER — AMPHETAMINE-DEXTROAMPHET ER 20 MG PO CP24
20.0000 mg | ORAL_CAPSULE | Freq: Every morning | ORAL | 0 refills | Status: AC
Start: 2024-03-04 — End: ?

## 2024-02-07 ENCOUNTER — Other Ambulatory Visit: Payer: Self-pay

## 2024-02-07 ENCOUNTER — Other Ambulatory Visit (HOSPITAL_COMMUNITY): Payer: Self-pay

## 2024-02-07 MED ORDER — AMPHETAMINE-DEXTROAMPHETAMINE 20 MG PO TABS
20.0000 mg | ORAL_TABLET | Freq: Every day | ORAL | 0 refills | Status: DC
  Filled 2024-02-07: qty 30, 30d supply, fill #0

## 2024-02-13 ENCOUNTER — Other Ambulatory Visit (HOSPITAL_COMMUNITY): Payer: Self-pay

## 2024-02-27 ENCOUNTER — Other Ambulatory Visit (HOSPITAL_COMMUNITY): Payer: Self-pay

## 2024-03-09 ENCOUNTER — Other Ambulatory Visit (HOSPITAL_COMMUNITY): Payer: Self-pay

## 2024-03-09 MED ORDER — AMPHETAMINE-DEXTROAMPHET ER 20 MG PO CP24
20.0000 mg | ORAL_CAPSULE | Freq: Every morning | ORAL | 0 refills | Status: AC
  Filled 2024-03-09: qty 30, 30d supply, fill #0

## 2024-03-09 MED ORDER — AMPHETAMINE-DEXTROAMPHETAMINE 20 MG PO TABS
20.0000 mg | ORAL_TABLET | Freq: Every day | ORAL | 0 refills | Status: AC
  Filled 2024-03-09: qty 30, 30d supply, fill #0

## 2024-03-15 ENCOUNTER — Other Ambulatory Visit (HOSPITAL_COMMUNITY): Payer: Self-pay

## 2024-03-15 DIAGNOSIS — F330 Major depressive disorder, recurrent, mild: Secondary | ICD-10-CM | POA: Diagnosis not present

## 2024-03-15 DIAGNOSIS — F900 Attention-deficit hyperactivity disorder, predominantly inattentive type: Secondary | ICD-10-CM | POA: Diagnosis not present

## 2024-03-15 MED ORDER — AMPHETAMINE-DEXTROAMPHET ER 20 MG PO CP24: 20.0000 mg | ORAL_CAPSULE | Freq: Every morning | ORAL | 0 refills | Status: AC

## 2024-03-15 MED ORDER — AMPHETAMINE-DEXTROAMPHETAMINE 20 MG PO TABS: 20.0000 mg | ORAL_TABLET | Freq: Every day | ORAL | 0 refills | Status: AC

## 2024-03-15 MED ORDER — FLUOXETINE HCL 20 MG PO CAPS
20.0000 mg | ORAL_CAPSULE | Freq: Every morning | ORAL | 3 refills | Status: AC
  Filled 2024-03-15: qty 30, 30d supply, fill #0

## 2024-04-01 ENCOUNTER — Other Ambulatory Visit (HOSPITAL_COMMUNITY): Payer: Self-pay

## 2024-04-13 ENCOUNTER — Other Ambulatory Visit (HOSPITAL_COMMUNITY): Payer: Self-pay

## 2024-04-13 MED ORDER — AMPHETAMINE-DEXTROAMPHETAMINE 20 MG PO TABS: 20.0000 mg | ORAL_TABLET | Freq: Every evening | ORAL | 0 refills | Status: AC

## 2024-04-13 MED ORDER — AMPHETAMINE-DEXTROAMPHET ER 20 MG PO CP24: 20.0000 mg | ORAL_CAPSULE | Freq: Every day | ORAL | 0 refills | Status: AC

## 2024-04-13 MED ORDER — AMPHETAMINE-DEXTROAMPHETAMINE 20 MG PO TABS
20.0000 mg | ORAL_TABLET | Freq: Every day | ORAL | 0 refills | Status: AC
  Filled 2024-04-13: qty 30, 30d supply, fill #0

## 2024-04-13 MED ORDER — AMPHETAMINE-DEXTROAMPHET ER 20 MG PO CP24
20.0000 mg | ORAL_CAPSULE | Freq: Every morning | ORAL | 0 refills | Status: AC
  Filled 2024-04-13: qty 30, 30d supply, fill #0

## 2024-05-17 ENCOUNTER — Other Ambulatory Visit: Payer: Self-pay | Admitting: Medical Genetics

## 2024-05-17 DIAGNOSIS — Z006 Encounter for examination for normal comparison and control in clinical research program: Secondary | ICD-10-CM

## 2024-05-20 ENCOUNTER — Other Ambulatory Visit (HOSPITAL_COMMUNITY): Payer: Self-pay

## 2024-05-20 MED ORDER — AMPHETAMINE-DEXTROAMPHETAMINE 20 MG PO TABS
20.0000 mg | ORAL_TABLET | Freq: Every day | ORAL | 0 refills | Status: AC
  Filled 2024-05-20: qty 30, 30d supply, fill #0

## 2024-05-20 MED ORDER — AMPHETAMINE-DEXTROAMPHET ER 20 MG PO CP24
20.0000 mg | ORAL_CAPSULE | ORAL | 0 refills | Status: AC
  Filled 2024-05-20: qty 30, 30d supply, fill #0

## 2024-05-21 ENCOUNTER — Other Ambulatory Visit (HOSPITAL_COMMUNITY): Payer: Self-pay

## 2024-05-25 ENCOUNTER — Other Ambulatory Visit (HOSPITAL_COMMUNITY): Payer: Self-pay

## 2024-05-25 DIAGNOSIS — F900 Attention-deficit hyperactivity disorder, predominantly inattentive type: Secondary | ICD-10-CM | POA: Diagnosis not present

## 2024-05-25 DIAGNOSIS — F330 Major depressive disorder, recurrent, mild: Secondary | ICD-10-CM | POA: Diagnosis not present

## 2024-05-25 DIAGNOSIS — F411 Generalized anxiety disorder: Secondary | ICD-10-CM | POA: Diagnosis not present

## 2024-05-25 MED ORDER — AMPHETAMINE-DEXTROAMPHETAMINE 20 MG PO TABS: 20.0000 mg | ORAL_TABLET | Freq: Every day | ORAL | 0 refills | Status: AC

## 2024-05-25 MED ORDER — AMPHETAMINE-DEXTROAMPHET ER 20 MG PO CP24: 20.0000 mg | ORAL_CAPSULE | Freq: Every morning | ORAL | 0 refills | Status: AC

## 2024-05-25 MED ORDER — FLUOXETINE HCL 20 MG PO CAPS
20.0000 mg | ORAL_CAPSULE | Freq: Every morning | ORAL | 3 refills | Status: DC
  Filled 2024-05-31: qty 30, 30d supply, fill #0
  Filled 2024-07-02 – 2024-07-13 (×2): qty 30, 30d supply, fill #1
  Filled 2024-08-11: qty 30, 30d supply, fill #2
  Filled 2024-09-13: qty 30, 30d supply, fill #3

## 2024-05-25 MED ORDER — AMPHETAMINE-DEXTROAMPHET ER 20 MG PO CP24
20.0000 mg | ORAL_CAPSULE | Freq: Every morning | ORAL | 0 refills | Status: AC
  Filled 2024-06-23: qty 30, 30d supply, fill #0

## 2024-05-25 MED ORDER — AMPHETAMINE-DEXTROAMPHETAMINE 20 MG PO TABS
20.0000 mg | ORAL_TABLET | Freq: Every day | ORAL | 0 refills | Status: AC
  Filled 2024-06-24: qty 30, 30d supply, fill #0

## 2024-06-01 ENCOUNTER — Other Ambulatory Visit (HOSPITAL_COMMUNITY): Payer: Self-pay

## 2024-06-23 ENCOUNTER — Other Ambulatory Visit (HOSPITAL_COMMUNITY): Payer: Self-pay

## 2024-06-24 ENCOUNTER — Other Ambulatory Visit (HOSPITAL_COMMUNITY): Payer: Self-pay

## 2024-07-01 ENCOUNTER — Encounter: Payer: 59 | Admitting: Urgent Care

## 2024-07-06 ENCOUNTER — Ambulatory Visit (INDEPENDENT_AMBULATORY_CARE_PROVIDER_SITE_OTHER): Admitting: Urgent Care

## 2024-07-06 ENCOUNTER — Encounter: Payer: Self-pay | Admitting: Urgent Care

## 2024-07-06 VITALS — BP 102/66 | HR 82 | Ht 66.0 in | Wt 142.0 lb

## 2024-07-06 DIAGNOSIS — Z0000 Encounter for general adult medical examination without abnormal findings: Secondary | ICD-10-CM

## 2024-07-06 DIAGNOSIS — B079 Viral wart, unspecified: Secondary | ICD-10-CM | POA: Diagnosis not present

## 2024-07-06 NOTE — Patient Instructions (Signed)
 We completed your annual physical today.  Please return in three weeks for re-freeze of the thigh.

## 2024-07-06 NOTE — Progress Notes (Signed)
 Complete physical exam  Patient: Kelly Haynes   DOB: [birth year]   33 y.o. Female  MRN: [id number]  Subjective:    Chief Complaint  Patient presents with   Annual Exam    Wart on left thigh    Kelly Haynes is a 33 y.o. female who presents today for a complete physical exam. She reports consuming a general diet. Home exercise routine includes pelaton at home 3-4 days a week. She generally feels well. She reports sleeping well. She does have additional problems to discuss today.   Discussed the use of AI scribe software for clinical note transcription with the patient, who gave verbal consent to proceed.  History of Present Illness   Kelly Haynes is a 33 year old female who presents for an annual physical exam.  She has a history of anxiety, managed with fluoxetine  20 mg daily and Adderall  20 mg daily. She has an IUD in place and follows up with gynecology for routine care.  She has a persistent wart on her left lower leg thigh, which has been treated with cryotherapy three times. The wart was biopsied and identified as verruca vulgaris. A mole on her back was also biopsied and found to be non-concerning.  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 She exercises three to four times a week, primarily using her Peloton. Her diet includes overnight oats for breakfast, smoothies for lunch, and a variety of dinners, often including salads at work.  No chronic infections, smoking, or sleep problems. Adjusting the timing of Adderall  has resolved previous sleep issues. She is up to date with dental and eye care, wearing glasses for computer work. She had a history of HPV, which has resolved, allowing her to return to a less frequent Pap smear schedule.  Her last comprehensive blood work was in December 2022, with a lipid panel done in August 2023.       Most recent fall risk assessment:    08/22/2022    8:58 AM  Fall Risk   Falls in the past  year? 0  Number falls in past yr: 0  Injury with Fall? 0  Risk for fall due to : No Fall Risks  Follow up Falls evaluation completed      Data saved with a previous flowsheet row definition     Most recent depression screenings:    08/22/2022    8:58 AM 04/25/2022    3:39 PM  PHQ 2/9 Scores  PHQ - 2 Score 0 0    Vision:Within last year and Dental: No current dental problems and Receives regular dental care  Patient Active Problem List   Diagnosis Date Noted   Routine adult health maintenance 07/01/2023   Other fatigue 08/22/2022   Plantar wart 04/25/2022   Screening for lipid disorders 04/25/2022   Generalized anxiety disorder 10/18/2021   Major depressive disorder, recurrent episode, mild 10/18/2021   Major depressive disorder, recurrent episode, moderate (HCC) 10/18/2021   History of loop electrical excision procedure (LEEP) 06/22/2020   History of abnormal cervical Pap smear 02/28/2017   Attention deficit hyperactivity disorder (ADHD), predominantly inattentive type 02/28/2017   S/P tonsillectomy 06/16/2015   Anxiety 06/16/2015   Depression with anxiety 06/16/2015   Lactose intolerance 06/16/2015   Gastric polyp 06/16/2015   IUD (intrauterine device) in place 06/16/2015   Gastro-esophageal reflux disease without esophagitis 06/13/2015   Irritable bowel syndrome 01/15/2012   Past Medical History:  Diagnosis Date   Abnormal Pap  smear of cervix    ADHD    Anxiety state 06/16/2015   Depression 06/16/2015   GERD (gastroesophageal reflux disease) 06/13/2015   Lactose intolerance 06/16/2015   Past Surgical History:  Procedure Laterality Date   COCCYX REMOVAL  2012   TONSILLECTOMY  06/16/2015   WISDOM TOOTH EXTRACTION  2009   Social History   Tobacco Use   Smoking status: Never   Smokeless tobacco: Never  Vaping Use   Vaping status: Never Used  Substance Use Topics   Alcohol use: Not Currently    Comment: 1-2 drinks pre month   Drug use: Never       Patient Care Team: Lowella Benton CROME, [place] as PCP - General (Physician Assistant) Connee Nest, PA-C (Inactive) (Dermatology) Livingston Rigg, MD as Consulting Physician (Dermatology)   Outpatient Medications Prior to Visit  Medication Sig   [START ON 07/20/2024] amphetamine -dextroamphetamine  (ADDERALL ) 20 MG tablet Take 1 tablet (20 mg total) by mouth daily at 12 noon.   fluconazole  (DIFLUCAN ) 150 MG tablet Take 1 tablet (150 mg total) by mouth daily. Take one dose today and repeat in 3 days. (Patient not taking: Reported on 07/06/2024)   FLUoxetine  (PROZAC ) 20 MG capsule Take 1 capsule (20 mg total) by mouth every morning.   levonorgestrel  (MIRENA ) 20 MCG/24HR IUD by Intrauterine route.   Omega-3 Fatty Acids (FISH OIL OMEGA-3 PO) Take by mouth. 4080mg  daily   TURMERIC-GINGER PO Take by mouth. 1950mg  daily   amphetamine -dextroamphetamine  (ADDERALL  XR) 20 MG 24 hr capsule Take 1 capsule (20 mg total) by mouth 2 (two) times daily.   amphetamine -dextroamphetamine  (ADDERALL  XR) 20 MG 24 hr capsule Take 1 capsule (20 mg total) by mouth 2 (two) times daily.   amphetamine -dextroamphetamine  (ADDERALL  XR) 20 MG 24 hr capsule Take 1 capsule (20 mg total) by mouth 2 (two) times daily. (10-16-22)   amphetamine -dextroamphetamine  (ADDERALL  XR) 20 MG 24 hr capsule Take 1 capsule (20 mg total) by mouth 2 (two) times daily.   amphetamine -dextroamphetamine  (ADDERALL  XR) 20 MG 24 hr capsule Take 1 capsule (20 mg total) by mouth 2 (two) times daily.   amphetamine -dextroamphetamine  (ADDERALL  XR) 20 MG 24 hr capsule Take 1 capsule (20 mg total) by mouth 2 (two) times daily.   amphetamine -dextroamphetamine  (ADDERALL  XR) 20 MG 24 hr capsule Take 1 capsule (20 mg total) by mouth 2 (two) times daily.   amphetamine -dextroamphetamine  (ADDERALL  XR) 20 MG 24 hr capsule Take 1 capsule (20 mg total) by mouth 2 (two) times daily.   amphetamine -dextroamphetamine  (ADDERALL  XR) 20 MG 24 hr capsule Take 1 capsule (20 mg  total) by mouth 2 (two) times daily.   amphetamine -dextroamphetamine  (ADDERALL  XR) 20 MG 24 hr capsule Take 1 capsule (20 mg total) by mouth every morning.   amphetamine -dextroamphetamine  (ADDERALL  XR) 20 MG 24 hr capsule Take 1 capsule (20 mg total) by mouth in the morning.   amphetamine -dextroamphetamine  (ADDERALL  XR) 20 MG 24 hr capsule Take 1 capsule (20 mg total) by mouth in the morning.   amphetamine -dextroamphetamine  (ADDERALL  XR) 20 MG 24 hr capsule Take 1 capsule (20 mg total) by mouth in the morning.   amphetamine -dextroamphetamine  (ADDERALL  XR) 20 MG 24 hr capsule Take 1 capsule (20 mg total) by mouth in the morning for ADHD.   amphetamine -dextroamphetamine  (ADDERALL  XR) 20 MG 24 hr capsule Take 1 capsule (20 mg total) by mouth in the morning for ADHD. (03/04/24)   amphetamine -dextroamphetamine  (ADDERALL  XR) 20 MG 24 hr capsule Take 1 capsule (20 mg total) by mouth in the  morning for ADHD.   amphetamine -dextroamphetamine  (ADDERALL  XR) 20 MG 24 hr capsule Take 1 capsule (20 mg total) by mouth in the morning for ADHD.   amphetamine -dextroamphetamine  (ADDERALL  XR) 20 MG 24 hr capsule Take 1 capsule (20 mg total) by mouth every morning for ADHD.   amphetamine -dextroamphetamine  (ADDERALL  XR) 20 MG 24 hr capsule Take 1 capsule (20 mg total) by mouth in the morning for ADHD.   amphetamine -dextroamphetamine  (ADDERALL  XR) 20 MG 24 hr capsule Take 1 capsule (20 mg total) by mouth daily in the morning for ADHD.   amphetamine -dextroamphetamine  (ADDERALL  XR) 20 MG 24 hr capsule Take 1 capsule (20 mg total) by mouth daily at noon for ADHD.   amphetamine -dextroamphetamine  (ADDERALL  XR) 20 MG 24 hr capsule Take 1 capsule (20 mg total) by mouth every morning for ADHD.   amphetamine -dextroamphetamine  (ADDERALL  XR) 20 MG 24 hr capsule Take 1 capsule (20 mg total) by mouth every morning for ADHD.   amphetamine -dextroamphetamine  (ADDERALL  XR) 20 MG 24 hr capsule Take 1 capsule (20 mg total) by mouth in the  morning for ADHD.   [START ON 07/20/2024] amphetamine -dextroamphetamine  (ADDERALL  XR) 20 MG 24 hr capsule Take 1 capsule (20 mg total) by mouth in the morning for ADHD.   amphetamine -dextroamphetamine  (ADDERALL  XR) 25 MG 24 hr capsule Take 1 capsule by mouth in the morning.   amphetamine -dextroamphetamine  (ADDERALL ) 20 MG tablet Take 1 tablet (20 mg total) by mouth daily in the afternoon.   amphetamine -dextroamphetamine  (ADDERALL ) 20 MG tablet Take 1 tablet (20 mg total) by mouth daily at 12 noon for ADHD..   amphetamine -dextroamphetamine  (ADDERALL ) 20 MG tablet Take 1 tablet (20 mg total) by mouth daily at 12 noon for ADHD.   amphetamine -dextroamphetamine  (ADDERALL ) 20 MG tablet Take 1 tablet (20 mg total) by mouth daily at 12 noon for ADHD.   amphetamine -dextroamphetamine  (ADDERALL ) 20 MG tablet Take 1 tablet (20 mg total) by mouth daily at 12 noon for ADHD.   amphetamine -dextroamphetamine  (ADDERALL ) 20 MG tablet Take 1 tablet (20 mg total) by mouth every afternoon for ADHD.   amphetamine -dextroamphetamine  (ADDERALL ) 20 MG tablet Take 1 tablet (20 mg total) by mouth daily at noon, for ADHD.   amphetamine -dextroamphetamine  (ADDERALL ) 20 MG tablet Take 1 tablet (20 mg total) by mouth daily at 12 noon for ADHD.   amphetamine -dextroamphetamine  (ADDERALL ) 20 MG tablet Take 1 tablet (20 mg total) by mouth daily at 12 noon for ADHD.   amphetamine -dextroamphetamine  (ADDERALL ) 20 MG tablet Take 1 tablet (20 mg total) by mouth daily at 12 noon for ADHD.   FLUoxetine  (PROZAC ) 20 MG capsule Take 1 capsule (20 mg total) by mouth daily.   FLUoxetine  (PROZAC ) 20 MG capsule Take 1 capsule (20 mg total) by mouth in the morning.   FLUoxetine  (PROZAC ) 20 MG capsule Take 1 capsule (20 mg total) by mouth in the morning.   No facility-administered medications prior to visit.    ROS Complete 12 point ROS performed with all pertinent positives listed in HPI      Objective:     BP 102/66   Pulse 82   Ht 5' 6  (1.676 m)   Wt 142 lb (64.4 kg)   SpO2 98%   BMI 22.92 kg/m  BP Readings from Last 3 Encounters:  07/06/24 102/66  08/08/23 120/71  07/01/23 115/72   Wt Readings from Last 3 Encounters:  07/06/24 142 lb (64.4 kg)  08/08/23 138 lb (62.6 kg)  07/01/23 139 lb 12 oz (63.4 kg)  Physical Exam Vitals and nursing note reviewed.  Constitutional:      General: She is not in acute distress.    Appearance: Normal appearance. She is not ill-appearing, toxic-appearing or diaphoretic.  HENT:     Head: Normocephalic and atraumatic.     Right Ear: Tympanic membrane, ear canal and external ear normal. There is no impacted cerumen.     Left Ear: Tympanic membrane, ear canal and external ear normal. There is no impacted cerumen.     Nose: Nose normal.     Mouth/Throat:     Mouth: Mucous membranes are moist.     Pharynx: Oropharynx is clear. No oropharyngeal exudate or posterior oropharyngeal erythema.  Eyes:     General: No scleral icterus.       Right eye: No discharge.        Left eye: No discharge.     Extraocular Movements: Extraocular movements intact.     Pupils: Pupils are equal, round, and reactive to light.  Neck:     Thyroid: No thyroid mass, thyromegaly or thyroid tenderness.  Cardiovascular:     Rate and Rhythm: Normal rate and regular rhythm.     Pulses: Normal pulses.     Heart sounds: No murmur heard. Pulmonary:     Effort: Pulmonary effort is normal. No respiratory distress.     Breath sounds: Normal breath sounds. No stridor. No wheezing or rhonchi.  Abdominal:     General: Abdomen is flat. Bowel sounds are normal. There is no distension.     Palpations: Abdomen is soft. There is no mass.     Tenderness: There is no abdominal tenderness. There is no guarding.  Musculoskeletal:     Cervical back: Normal range of motion and neck supple. No rigidity or tenderness.     Right lower leg: No edema.     Left lower leg: No edema.  Lymphadenopathy:     Cervical: No  cervical adenopathy.  Skin:    General: Skin is warm and dry.     Coloration: Skin is not jaundiced.     Findings: Lesion (verruca to L midline anterior thigh) present. No bruising, erythema or rash.  Neurological:     General: No focal deficit present.     Mental Status: She is alert and oriented to person, place, and time.     Sensory: No sensory deficit.     Motor: No weakness.  Psychiatric:        Mood and Affect: Mood normal.        Behavior: Behavior normal.    Skin excision  Date/Time: 07/06/2024 10:35 AM  Performed by: Lowella Benton CROME, PA Authorized by: Lowella Benton CROME, PA   Number of Lesions: 1 Lesion 1:    Body area: lower extremity   Lower extremity location: L upper leg   Initial size (mm): 2   Malignancy: benign lesion     Malignancy comment: verruca vulgaris confirmed with bx previously   Destruction method: cryotherapy     Repair comments: Three freeze-thaw cycles performed      No results found for any visits on 07/06/24. Last CBC Lab Results  Component Value Date   WBC 6.8 09/06/2021   HGB 14.1 09/06/2021   HCT 40.3 09/06/2021   MCV 91.4 09/06/2021   MCH 32.0 09/06/2021   RDW 12.2 09/06/2021   PLT 231 09/06/2021   Last metabolic panel Lab Results  Component Value Date   GLUCOSE 83 09/06/2021   NA 139 09/06/2021  K 4.4 09/06/2021   CL 103 09/06/2021   CO2 27 09/06/2021   BUN 14 09/06/2021   CREATININE 0.85 09/06/2021   CALCIUM 10.2 09/06/2021   PROT 7.6 09/06/2021   ALBUMIN 4.9 05/31/2014   BILITOT 0.5 09/06/2021   AST 18 09/06/2021   ALT 15 09/06/2021   Last lipids Lab Results  Component Value Date   CHOL 183 04/25/2022   HDL 67 04/25/2022   LDLCALC 92 04/25/2022   TRIG 142 04/25/2022   CHOLHDL 2.7 04/25/2022   Last hemoglobin A1c No results found for: HGBA1C Last thyroid functions Lab Results  Component Value Date   TSH 1.18 09/06/2021   Last vitamin D  Lab Results  Component Value Date   VD25OH 33 08/22/2022    Last vitamin B12 and Folate Lab Results  Component Value Date   VITAMINB12 1,037 08/22/2022   FOLATE 15.7 08/22/2022        Assessment & Plan:    Routine Health Maintenance and Physical Exam  Immunization History  Administered Date(s) Administered   DTP 05/28/1990   DTaP 04/08/1989, 06/23/1989, 09/02/1989, 05/28/1990, 02/25/1994   DTaP / Hep B / IPV 04/26/1996, 05/31/1996, 11/01/1996, 09/10/2013   HIB (PRP-OMP) 09/02/1989, 12/02/1989, 05/28/1990   HIB (PRP-T) 09/02/1989, 12/02/1989, 05/28/1990   HPV Quadrivalent 10/28/2005, 01/15/2006, 05/15/2006   Hepatitis A 10/28/2005, 05/15/2006   Hepatitis A, Adult 10/28/2005, 05/15/2006   Hepatitis A, Ped/Adol-2 Dose 10/28/2005, 05/15/2006   Hepatitis B 04/26/1996, 05/31/1996, 11/01/1996, 09/10/2013   Hepatitis B, PED/ADOLESCENT 04/26/1996, 05/31/1996, 11/01/1996, 09/10/2013   IPV 04/08/1989, 06/23/1989, 05/28/1990, 02/25/1994   Influenza,inj,Quad PF,6+ Mos 06/22/2013, 06/13/2015, 05/23/2017   Influenza-Unspecified 06/22/2013, 06/23/2016, 06/30/2019, 06/16/2020, 06/23/2021   MMR 05/28/1990, 04/29/1994   Meningococcal Conjugate 10/28/2005   OPV 05/28/1990   PFIZER(Purple Top)SARS-COV-2 Vaccination 09/13/2019, 10/04/2019, 07/07/2020, 06/23/2021   PPD Test 09/30/2011, 09/08/2013, 09/19/2014, 09/19/2014   Td 01/15/2000, 01/15/2003, 10/28/2005, 02/28/2017   Tdap 10/28/2005, 03/31/2018   Varicella 04/29/1994    Health Maintenance  Topic Date Due   COVID-19 Vaccine (5 - 2025-26 season) 05/24/2024   Influenza Vaccine  12/21/2024 (Originally 04/23/2024)   Colonoscopy  04/27/2027   DTaP/Tdap/Td (13 - Td or Tdap) 03/31/2028   Cervical Cancer Screening (HPV/Pap Cotest)  08/07/2028   Hepatitis B Vaccines 19-59 Average Risk  Completed   HPV VACCINES  Completed   Hepatitis C Screening  Completed   HIV Screening  Completed   Pneumococcal Vaccine  Aged Out   Meningococcal B Vaccine  Aged Out    Discussed health benefits of physical  activity, and encouraged her to engage in regular exercise appropriate for her age and condition.  Problem List Items Addressed This Visit     Routine adult health maintenance - Primary   Relevant Orders   CBC with Differential/Platelet   Hemoglobin A1c   TSH   Lipid panel   Comprehensive metabolic panel with GFR   Other Visit Diagnoses       Verruca vulgaris of lower extremity       Relevant Orders   Cryotherapy/destruct benign or premalignant lesion      Return in about 3 weeks (around 07/27/2024).   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 - Offer COVID-19 vaccine.  Verruca vulgaris of left lower leg/thigh Persistent verruca vulgaris on the left lower leg/thigh, previously biopsied and confirmed. Prefers cryotherapy due to ease and proximity to clinic. - Perform cryotherapy with  three freeze-thaw cycles. - Schedule follow-up in three weeks for repeat cryotherapy if lesion persists.  Anxiety disorder Managed by [facility]. Currently on fluoxetine  20 mg daily.  Attention-deficit hyperactivity disorder Managed by [facility]. Currently on Adderall  20 mg daily.  General Health Maintenance Up to date on most health maintenance screenings and vaccinations. Regular dental and eye exams maintained. Pap smear normal two years ago. Previously had HPV but resolved, now on routine screening schedule. - Order routine blood work as part of annual screening.          Benton LITTIE Gave, PA

## 2024-07-07 LAB — LIPID PANEL
Chol/HDL Ratio: 2.6 ratio (ref 0.0–4.4)
Cholesterol, Total: 182 mg/dL (ref 100–199)
HDL: 71 mg/dL (ref >39)
LDL Chol Calc (NIH): 90 mg/dL (ref 0–99)
Triglycerides: 118 mg/dL (ref 0–149)
VLDL Cholesterol Cal: 21 mg/dL (ref 5–40)

## 2024-07-07 LAB — CBC WITH DIFFERENTIAL/PLATELET
Basophils Absolute: 0 x10E3/uL (ref 0.0–0.2)
Basos: 1 %
EOS (ABSOLUTE): 0.1 x10E3/uL (ref 0.0–0.4)
Eos: 2 %
Hematocrit: 43.3 % (ref 34.0–46.6)
Hemoglobin: 14 g/dL (ref 11.1–15.9)
Immature Grans (Abs): 0 x10E3/uL (ref 0.0–0.1)
Immature Granulocytes: 0 %
Lymphocytes Absolute: 2.7 x10E3/uL (ref 0.7–3.1)
Lymphs: 42 %
MCH: 31.1 pg (ref 26.6–33.0)
MCHC: 32.3 g/dL (ref 31.5–35.7)
MCV: 96 fL (ref 79–97)
Monocytes Absolute: 0.4 x10E3/uL (ref 0.1–0.9)
Monocytes: 7 %
Neutrophils Absolute: 3.1 x10E3/uL (ref 1.4–7.0)
Neutrophils: 48 %
Platelets: 235 x10E3/uL (ref 150–450)
RBC: 4.5 x10E6/uL (ref 3.77–5.28)
RDW: 12.7 % (ref 11.7–15.4)
WBC: 6.4 x10E3/uL (ref 3.4–10.8)

## 2024-07-07 LAB — HEMOGLOBIN A1C
Est. average glucose Bld gHb Est-mCnc: 103 mg/dL
Hgb A1c MFr Bld: 5.2 % (ref 4.8–5.6)

## 2024-07-07 LAB — COMPREHENSIVE METABOLIC PANEL WITH GFR
ALT: 24 IU/L (ref 0–32)
AST: 19 IU/L (ref 0–40)
Albumin: 4.7 g/dL (ref 3.9–4.9)
Alkaline Phosphatase: 87 IU/L (ref 41–116)
BUN/Creatinine Ratio: 12 (ref 9–23)
BUN: 10 mg/dL (ref 6–20)
Bilirubin Total: 0.4 mg/dL (ref 0.0–1.2)
CO2: 22 mmol/L (ref 20–29)
Calcium: 10.2 mg/dL (ref 8.7–10.2)
Chloride: 100 mmol/L (ref 96–106)
Creatinine, Ser: 0.84 mg/dL (ref 0.57–1.00)
Globulin, Total: 2.4 g/dL (ref 1.5–4.5)
Glucose: 88 mg/dL (ref 70–99)
Potassium: 4.2 mmol/L (ref 3.5–5.2)
Sodium: 138 mmol/L (ref 134–144)
Total Protein: 7.1 g/dL (ref 6.0–8.5)
eGFR: 93 mL/min/1.73 (ref >59)

## 2024-07-07 LAB — TSH: TSH: 0.954 u[IU]/mL (ref 0.450–4.500)

## 2024-07-08 ENCOUNTER — Ambulatory Visit: Payer: Self-pay | Admitting: Urgent Care

## 2024-07-12 ENCOUNTER — Other Ambulatory Visit (HOSPITAL_COMMUNITY): Payer: Self-pay

## 2024-07-12 NOTE — Telephone Encounter (Signed)
 Attempted to contact patient but unfortunately was sent to voicemail. Per DPR, I was able to leave a detailed voice message to [phone]. I have left a message regarding results and requesting a call back to our office with any questions or concerns.

## 2024-07-13 ENCOUNTER — Other Ambulatory Visit (HOSPITAL_COMMUNITY): Payer: Self-pay

## 2024-07-27 ENCOUNTER — Other Ambulatory Visit (HOSPITAL_COMMUNITY): Payer: Self-pay

## 2024-07-27 ENCOUNTER — Ambulatory Visit: Admitting: Urgent Care

## 2024-07-27 MED ORDER — AMPHETAMINE-DEXTROAMPHETAMINE 20 MG PO TABS
20.0000 mg | ORAL_TABLET | Freq: Every day | ORAL | 0 refills | Status: AC
  Filled 2024-07-27: qty 30, 30d supply, fill #0

## 2024-07-27 MED ORDER — AMPHETAMINE-DEXTROAMPHET ER 20 MG PO CP24
20.0000 mg | ORAL_CAPSULE | Freq: Every morning | ORAL | 0 refills | Status: AC
  Filled 2024-07-27: qty 30, 30d supply, fill #0

## 2024-07-29 ENCOUNTER — Ambulatory Visit: Admitting: Urgent Care

## 2024-07-29 ENCOUNTER — Encounter: Payer: Self-pay | Admitting: Urgent Care

## 2024-07-29 VITALS — BP 104/68 | HR 68 | Ht 66.0 in | Wt 142.0 lb

## 2024-07-29 DIAGNOSIS — L989 Disorder of the skin and subcutaneous tissue, unspecified: Secondary | ICD-10-CM | POA: Diagnosis not present

## 2024-07-30 NOTE — Progress Notes (Signed)
 Established Patient Office Visit  Subjective:  Patient ID: Kelly Haynes, female    DOB: Dec 12, 1990  Age: 33 y.o. MRN: [id number]  Chief Complaint  Patient presents with   Wart Removal    Left thigh    Pt presents to re-freeze a skin lesion on the L anterior thigh. She believes this was biopsied in the past and found to be a verruca skin lesion. We froze it about a month ago, but she declines any improvement in the area. It is not getting bigger. It is asx.    Patient Active Problem List   Diagnosis Date Noted   Routine adult health maintenance 07/01/2023   Other fatigue 08/22/2022   Plantar wart 04/25/2022   Screening for lipid disorders 04/25/2022   Generalized anxiety disorder 10/18/2021   Major depressive disorder, recurrent episode, mild 10/18/2021   Major depressive disorder, recurrent episode, moderate (HCC) 10/18/2021   History of loop electrical excision procedure (LEEP) 06/22/2020   History of abnormal cervical Pap smear 02/28/2017   Attention deficit hyperactivity disorder (ADHD), predominantly inattentive type 02/28/2017   S/P tonsillectomy 06/16/2015   Anxiety 06/16/2015   Depression with anxiety 06/16/2015   Lactose intolerance 06/16/2015   Gastric polyp 06/16/2015   IUD (intrauterine device) in place 06/16/2015   Gastro-esophageal reflux disease without esophagitis 06/13/2015   Irritable bowel syndrome 01/15/2012   Past Medical History:  Diagnosis Date   Abnormal Pap smear of cervix    ADHD    Anxiety state 06/16/2015   Depression 06/16/2015   GERD (gastroesophageal reflux disease) 06/13/2015   Lactose intolerance 06/16/2015   Past Surgical History:  Procedure Laterality Date   COCCYX REMOVAL  2012   TONSILLECTOMY  06/16/2015   WISDOM TOOTH EXTRACTION  2009   Social History   Tobacco Use   Smoking status: Never   Smokeless tobacco: Never  Vaping Use   Vaping status: Never Used  Substance Use Topics   Alcohol use: Not Currently    Comment: 1-2  drinks pre month   Drug use: Never      ROS: as noted in HPI  Objective:     BP 104/68 (BP Location: Left Arm, Patient Position: Sitting, Cuff Size: Normal)   Pulse 68   Ht 5' 6 (1.676 m)   Wt 142 lb (64.4 kg)   SpO2 100%   BMI 22.92 kg/m  BP Readings from Last 3 Encounters:  07/29/24 104/68  07/06/24 102/66  08/08/23 120/71   Wt Readings from Last 3 Encounters:  07/29/24 142 lb (64.4 kg)  07/06/24 142 lb (64.4 kg)  08/08/23 138 lb (62.6 kg)      Physical Exam Vitals and nursing note reviewed.  Constitutional:      General: She is not in acute distress.    Appearance: Normal appearance. She is normal weight. She is not ill-appearing, toxic-appearing or diaphoretic.  HENT:     Head: Normocephalic and atraumatic.  Cardiovascular:     Rate and Rhythm: Normal rate.  Pulmonary:     Effort: Pulmonary effort is normal. No respiratory distress.  Musculoskeletal:       Legs:  Skin:    General: Skin is warm and dry.     Coloration: Skin is not jaundiced.     Findings: Lesion present. No bruising, erythema or rash.  Neurological:     General: No focal deficit present.     Mental Status: She is alert and oriented to person, place, and time.  Psychiatric:  Mood and Affect: Mood normal.        Behavior: Behavior normal.    Cryotherapy/destruct benign or premalignant lesion  Date/Time: 07/29/2024 9:55 AM  Performed by: Lowella Benton CROME, PA Authorized by: Lowella Benton CROME, PA  Patient tolerance: patient tolerated the procedure well with no immediate complications Comments: 4 freeze-thaw cycles performed on single skin lesion L upper leg    No results found for any visits on 07/29/24.    The ASCVD Risk score (Arnett DK, et al., 2019) failed to calculate for the following reasons:   The 2019 ASCVD risk score is only valid for ages 82 to 49  Assessment & Plan:  Skin lesion of left lower limb -     Cryotherapy, skin lesion; Future  Pt will return in one  month for surgical excision rather than freezing if the area of induration remains despite above treatment.   No follow-ups on file.   Benton CROME Lowella, PA

## 2024-07-30 NOTE — Patient Instructions (Signed)
 We re-froze your skin lesion. Wait about 3 weeks to see what your response is to the procedure.  If no improvement, or if indurated area under skin persists, schedule a biopsy/ excision of the area.

## 2024-08-11 ENCOUNTER — Other Ambulatory Visit (HOSPITAL_COMMUNITY): Payer: Self-pay

## 2024-08-25 ENCOUNTER — Encounter (HOSPITAL_COMMUNITY): Payer: Self-pay

## 2024-08-25 ENCOUNTER — Other Ambulatory Visit: Payer: Self-pay

## 2024-08-25 ENCOUNTER — Other Ambulatory Visit (HOSPITAL_COMMUNITY): Payer: Self-pay

## 2024-08-25 MED ORDER — AMPHETAMINE-DEXTROAMPHET ER 20 MG PO CP24
20.0000 mg | ORAL_CAPSULE | Freq: Every morning | ORAL | 0 refills | Status: AC
  Filled 2024-08-25: qty 30, 30d supply, fill #0

## 2024-08-25 MED ORDER — FLUOXETINE HCL 20 MG PO CAPS: 20.0000 mg | ORAL_CAPSULE | Freq: Every morning | ORAL | 3 refills | Status: AC

## 2024-08-25 MED ORDER — AMPHETAMINE-DEXTROAMPHETAMINE 20 MG PO TABS
20.0000 mg | ORAL_TABLET | Freq: Every day | ORAL | 0 refills | Status: AC
  Filled 2024-08-25: qty 30, 30d supply, fill #0

## 2024-08-30 ENCOUNTER — Other Ambulatory Visit (HOSPITAL_COMMUNITY): Payer: Self-pay

## 2024-09-01 ENCOUNTER — Other Ambulatory Visit (HOSPITAL_COMMUNITY): Payer: Self-pay

## 2024-09-02 ENCOUNTER — Other Ambulatory Visit (HOSPITAL_COMMUNITY): Payer: Self-pay

## 2024-09-02 MED ORDER — AMPHETAMINE-DEXTROAMPHET ER 25 MG PO CP24
25.0000 mg | ORAL_CAPSULE | Freq: Every morning | ORAL | 0 refills | Status: AC
  Filled 2024-09-02: qty 30, 30d supply, fill #0

## 2024-09-15 DIAGNOSIS — F3342 Major depressive disorder, recurrent, in full remission: Secondary | ICD-10-CM | POA: Diagnosis not present

## 2024-09-15 DIAGNOSIS — F411 Generalized anxiety disorder: Secondary | ICD-10-CM | POA: Diagnosis not present

## 2024-09-15 DIAGNOSIS — F900 Attention-deficit hyperactivity disorder, predominantly inattentive type: Secondary | ICD-10-CM | POA: Diagnosis not present

## 2024-09-24 ENCOUNTER — Other Ambulatory Visit (HOSPITAL_COMMUNITY)

## 2024-10-04 ENCOUNTER — Other Ambulatory Visit: Payer: Self-pay | Admitting: Medical Genetics

## 2024-10-04 DIAGNOSIS — Z006 Encounter for examination for normal comparison and control in clinical research program: Secondary | ICD-10-CM

## 2024-10-18 ENCOUNTER — Other Ambulatory Visit: Payer: Self-pay

## 2024-10-18 ENCOUNTER — Other Ambulatory Visit (HOSPITAL_COMMUNITY): Payer: Self-pay

## 2024-10-18 MED ORDER — AMPHETAMINE-DEXTROAMPHETAMINE 20 MG PO TABS
20.0000 mg | ORAL_TABLET | Freq: Every day | ORAL | 0 refills | Status: AC
  Filled 2024-10-18: qty 30, 30d supply, fill #0

## 2024-10-18 MED ORDER — FLUOXETINE HCL 20 MG PO CAPS
20.0000 mg | ORAL_CAPSULE | Freq: Every morning | ORAL | 3 refills | Status: AC
  Filled 2024-10-18: qty 30, 30d supply, fill #0

## 2024-10-18 MED ORDER — FLUOXETINE HCL 20 MG PO CAPS
20.0000 mg | ORAL_CAPSULE | Freq: Every morning | ORAL | 1 refills | Status: AC
  Filled 2024-10-18: qty 90, 90d supply, fill #0

## 2024-10-20 ENCOUNTER — Other Ambulatory Visit (HOSPITAL_COMMUNITY): Payer: Self-pay

## 2024-10-20 ENCOUNTER — Encounter: Payer: Self-pay | Admitting: Urgent Care

## 2024-10-20 NOTE — Telephone Encounter (Signed)
 Kelly Haynes, Are you able to answer this patients question? She is wanting a leg lesion removed - if I did it, I would do a single punch biopsy (probably a 6mm) to her L thigh. Thanks!

## 2024-10-24 LAB — GENECONNECT MOLECULAR SCREEN: Genetic Analysis Overall Interpretation: NEGATIVE
